# Patient Record
Sex: Male | Born: 1980 | Race: Black or African American | Hispanic: No | Marital: Single | Smoking: Current every day smoker
Health system: Southern US, Community
[De-identification: ages and names within clinical notes are randomized; demographics above are authoritative.]

## PROBLEM LIST (undated history)

## (undated) DIAGNOSIS — M549 Dorsalgia, unspecified: Secondary | ICD-10-CM

## (undated) DIAGNOSIS — K852 Alcohol induced acute pancreatitis without necrosis or infection: Secondary | ICD-10-CM

---

## 2004-07-01 ENCOUNTER — Emergency Department (HOSPITAL_COMMUNITY): Admission: EM | Admit: 2004-07-01 | Discharge: 2004-07-01 | Payer: Self-pay | Admitting: Emergency Medicine

## 2004-07-02 ENCOUNTER — Emergency Department (HOSPITAL_COMMUNITY): Admission: EM | Admit: 2004-07-02 | Discharge: 2004-07-02 | Payer: Self-pay | Admitting: Emergency Medicine

## 2005-07-27 ENCOUNTER — Emergency Department (HOSPITAL_COMMUNITY): Admission: EM | Admit: 2005-07-27 | Discharge: 2005-07-27 | Payer: Self-pay | Admitting: Emergency Medicine

## 2006-04-05 ENCOUNTER — Emergency Department (HOSPITAL_COMMUNITY): Admission: EM | Admit: 2006-04-05 | Discharge: 2006-04-05 | Payer: Self-pay | Admitting: Emergency Medicine

## 2009-12-08 ENCOUNTER — Emergency Department (HOSPITAL_COMMUNITY): Admission: EM | Admit: 2009-12-08 | Discharge: 2009-12-08 | Payer: Self-pay | Admitting: Nurse Practitioner

## 2010-02-22 ENCOUNTER — Emergency Department (HOSPITAL_COMMUNITY): Admission: EM | Admit: 2010-02-22 | Discharge: 2010-02-22 | Payer: Self-pay | Admitting: Emergency Medicine

## 2010-03-05 DEATH — deceased

## 2010-03-09 ENCOUNTER — Emergency Department (HOSPITAL_COMMUNITY): Admission: EM | Admit: 2010-03-09 | Discharge: 2010-03-09 | Payer: Self-pay | Admitting: Emergency Medicine

## 2010-05-27 ENCOUNTER — Ambulatory Visit: Payer: Self-pay | Admitting: Diagnostic Radiology

## 2010-05-27 ENCOUNTER — Emergency Department (HOSPITAL_BASED_OUTPATIENT_CLINIC_OR_DEPARTMENT_OTHER): Admission: EM | Admit: 2010-05-27 | Discharge: 2010-05-27 | Payer: Self-pay | Admitting: Emergency Medicine

## 2010-12-07 ENCOUNTER — Emergency Department (HOSPITAL_BASED_OUTPATIENT_CLINIC_OR_DEPARTMENT_OTHER)
Admission: EM | Admit: 2010-12-07 | Discharge: 2010-12-07 | Disposition: A | Discharge status: Home / Self Care | Payer: Self-pay | Attending: Emergency Medicine | Admitting: Emergency Medicine

## 2010-12-07 DIAGNOSIS — M545 Low back pain, unspecified: Secondary | ICD-10-CM | POA: Insufficient documentation

## 2011-07-12 ENCOUNTER — Emergency Department (HOSPITAL_BASED_OUTPATIENT_CLINIC_OR_DEPARTMENT_OTHER)
Admission: EM | Admit: 2011-07-12 | Discharge: 2011-07-13 | Disposition: A | Discharge status: Home / Self Care | Payer: Self-pay | Attending: Emergency Medicine | Admitting: Emergency Medicine

## 2011-07-12 DIAGNOSIS — K089 Disorder of teeth and supporting structures, unspecified: Secondary | ICD-10-CM | POA: Insufficient documentation

## 2011-07-12 DIAGNOSIS — K0889 Other specified disorders of teeth and supporting structures: Secondary | ICD-10-CM

## 2011-07-12 NOTE — ED Notes (Signed)
Pt states that he was seen about 2 weeks ago for tooth pain at [facility], given vicodin 5/500 for pain, temporarily helped pain, pt has not seen dentist, told to be seen again for pain persisted.  Pt states that he has chipped teeth on the upper and lower jaw which are causing severe pain.

## 2011-07-13 MED ORDER — IBUPROFEN 400 MG PO TABS
400.0000 mg | ORAL_TABLET | Freq: Once | ORAL | Status: AC
  Administered 2011-07-13: 400 mg via ORAL
  Filled 2011-07-13: qty 1

## 2011-07-13 MED ORDER — AMOXICILLIN 500 MG PO CAPS: 500.0000 mg | ORAL_CAPSULE | Freq: Three times a day (TID) | ORAL | Status: AC

## 2011-07-13 MED ORDER — OXYCODONE-ACETAMINOPHEN 5-325 MG PO TABS
2.0000 | ORAL_TABLET | Freq: Once | ORAL | Status: AC
  Administered 2011-07-13: 2 via ORAL
  Filled 2011-07-13: qty 2

## 2011-07-13 MED ORDER — IBUPROFEN 400 MG PO TABS: 400.0000 mg | ORAL_TABLET | Freq: Four times a day (QID) | ORAL | Status: AC | PRN

## 2011-07-13 MED ORDER — OXYCODONE-ACETAMINOPHEN 5-325 MG PO TABS: 1.0000 | ORAL_TABLET | ORAL | Status: AC | PRN

## 2011-07-13 MED ORDER — CHLORHEXIDINE GLUCONATE 0.12 % MT SOLN: 15.0000 mL | Freq: Two times a day (BID) | OROMUCOSAL | Status: AC

## 2011-07-13 NOTE — ED Provider Notes (Signed)
History    29yM with toothache. Evaluated a couple weeks ago for same given abx and pain meds which helped somewhat. Pain worsening so came again for eval. Did not fu with dentistry. No fever ro chills. No difficulty breathing or swallowing. Smokes. Denies significant pmhx. Denies trauma.  CSN: [id number] Arrival date & time: 07/12/2011 10:09 PM   First MD Initiated Contact with Patient 07/12/11 2338      Chief Complaint  Patient presents with  . Dental Pain    (Consider location/radiation/quality/duration/timing/severity/associated sxs/prior treatment) HPI  History reviewed. No pertinent past medical history.  History reviewed. No pertinent past surgical history.  History reviewed. No pertinent family history.  History  Substance Use Topics  . Smoking status: Current Some Day Smoker    Types: Cigarettes  . Smokeless tobacco: Never Used  . Alcohol Use: Yes     occasionally      Review of Systems   Review of symptoms negative unless otherwise noted in HPI.   Allergies  Tramadol and Penicillins  Home Medications   Current Outpatient Rx  Name Route Sig Dispense Refill  . IBUPROFEN 800 MG PO TABS Oral Take 1,600 mg by mouth every 8 (eight) hours as needed. For pain     . AMOXICILLIN 500 MG PO CAPS Oral Take 1 capsule (500 mg total) by mouth 3 (three) times daily. 21 capsule 0  . CHLORHEXIDINE GLUCONATE 0.12 % MT SOLN Mouth/Throat Use as directed 15 mLs in the mouth or throat 2 (two) times daily. Swish and spit 120 mL 0  . IBUPROFEN 400 MG PO TABS Oral Take 1 tablet (400 mg total) by mouth every 6 (six) hours as needed for pain. 30 tablet 0  . OXYCODONE-ACETAMINOPHEN 5-325 MG PO TABS Oral Take 1 tablet by mouth every 4 (four) hours as needed for pain. 12 tablet 0    BP 117/85  Pulse 78  Temp(Src) 97.7 F (36.5 C) (Oral)  Resp 17  Ht 5\' 8"  (1.727 m)  Wt 160 lb (72.576 kg)  BMI 24.33 kg/m2  SpO2 100%  Physical Exam  Nursing note and vitals  reviewed. Constitutional: He appears well-developed and well-nourished. No distress.  HENT:  Head: Normocephalic and atraumatic.  Mouth/Throat: No oropharyngeal exudate.       Generally poor dentition. crackes in multiple teeth. No discrete drainable collection noted. Mild-moderate  Gingivitis. No bleeding noted. posteriror pharynx clear. Neck supple. No nodes. No trismus. hanlding secretions. Submental tissues soft.  Eyes: Conjunctivae are normal. Right eye exhibits no discharge. Left eye exhibits no discharge.  Neck: Normal range of motion. Neck supple.  Cardiovascular: Normal rate, regular rhythm and normal heart sounds.  Exam reveals no gallop and no friction rub.   No murmur heard. Pulmonary/Chest: Effort normal and breath sounds normal. No respiratory distress.  Musculoskeletal: He exhibits no edema and no tenderness.  Lymphadenopathy:    He has no cervical adenopathy.  Neurological: He is alert.  Skin: Skin is warm and dry.  Psychiatric: He has a normal mood and affect. His behavior is normal. Thought content normal.    ED Course  Procedures (including critical care time)  Labs Reviewed - No data to display No results found.   1. Pain, dental       MDM  29yM with dental pain. Generally poor dentition. Moderate gingivitis. No evidence of deep space infection. Handling secretions. No trismus. Submental tissues soft. Offered dental block but declined. Pt states allergy to PCN but usually given amox which hasn't had  reaction to. Reports allergy to tramadol but fine with percocet. Ongoing dental issues but says cant afford dental care. Resources provided.        Raeford Razor, MD 07/17/11 [phone]

## 2011-07-13 NOTE — ED Notes (Signed)
Care plan and safe use of meds reviewed with pt and family

## 2011-09-17 ENCOUNTER — Emergency Department (HOSPITAL_COMMUNITY)
Admission: EM | Admit: 2011-09-17 | Discharge: 2011-09-17 | Disposition: A | Discharge status: Home / Self Care | Payer: Self-pay | Source: Home / Self Care | Attending: Emergency Medicine | Admitting: Emergency Medicine

## 2012-06-02 ENCOUNTER — Emergency Department (HOSPITAL_BASED_OUTPATIENT_CLINIC_OR_DEPARTMENT_OTHER)
Admission: EM | Admit: 2012-06-02 | Discharge: 2012-06-02 | Discharge status: Against Medical Advice | Payer: Self-pay | Attending: Emergency Medicine | Admitting: Emergency Medicine

## 2012-06-02 ENCOUNTER — Encounter (HOSPITAL_BASED_OUTPATIENT_CLINIC_OR_DEPARTMENT_OTHER): Payer: Self-pay | Admitting: Emergency Medicine

## 2012-06-02 DIAGNOSIS — K089 Disorder of teeth and supporting structures, unspecified: Secondary | ICD-10-CM | POA: Insufficient documentation

## 2012-06-02 DIAGNOSIS — F17200 Nicotine dependence, unspecified, uncomplicated: Secondary | ICD-10-CM | POA: Insufficient documentation

## 2012-06-02 NOTE — ED Notes (Signed)
Called x 1 and no answer in lobby

## 2012-06-02 NOTE — ED Notes (Signed)
Pt having dental pain since last night.  Pt states he broke a tooth.  Pt has two areas of decayed teeth that are partially missing.

## 2012-06-02 NOTE — ED Notes (Signed)
Called for room assignment x 2.  Not in lobby.

## 2015-04-12 ENCOUNTER — Encounter (HOSPITAL_BASED_OUTPATIENT_CLINIC_OR_DEPARTMENT_OTHER): Payer: Self-pay | Admitting: *Deleted

## 2015-04-12 ENCOUNTER — Emergency Department (HOSPITAL_BASED_OUTPATIENT_CLINIC_OR_DEPARTMENT_OTHER)
Admission: EM | Admit: 2015-04-12 | Discharge: 2015-04-12 | Discharge status: Against Medical Advice | Payer: Self-pay | Attending: Emergency Medicine | Admitting: Emergency Medicine

## 2015-04-12 DIAGNOSIS — Y9389 Activity, other specified: Secondary | ICD-10-CM | POA: Insufficient documentation

## 2015-04-12 DIAGNOSIS — Z720 Tobacco use: Secondary | ICD-10-CM | POA: Insufficient documentation

## 2015-04-12 DIAGNOSIS — Y9289 Other specified places as the place of occurrence of the external cause: Secondary | ICD-10-CM | POA: Insufficient documentation

## 2015-04-12 DIAGNOSIS — X58XXXA Exposure to other specified factors, initial encounter: Secondary | ICD-10-CM | POA: Insufficient documentation

## 2015-04-12 DIAGNOSIS — S025XXA Fracture of tooth (traumatic), initial encounter for closed fracture: Secondary | ICD-10-CM | POA: Insufficient documentation

## 2015-04-12 DIAGNOSIS — Y998 Other external cause status: Secondary | ICD-10-CM | POA: Insufficient documentation

## 2015-04-12 NOTE — ED Notes (Signed)
Bottom left dental pain for several weeks- States tooth broke while eating today

## 2016-03-04 ENCOUNTER — Encounter (HOSPITAL_BASED_OUTPATIENT_CLINIC_OR_DEPARTMENT_OTHER): Payer: Self-pay | Admitting: Emergency Medicine

## 2016-03-04 DIAGNOSIS — F17210 Nicotine dependence, cigarettes, uncomplicated: Secondary | ICD-10-CM | POA: Insufficient documentation

## 2016-03-04 DIAGNOSIS — Z5321 Procedure and treatment not carried out due to patient leaving prior to being seen by health care provider: Secondary | ICD-10-CM | POA: Insufficient documentation

## 2016-03-04 DIAGNOSIS — Y929 Unspecified place or not applicable: Secondary | ICD-10-CM | POA: Insufficient documentation

## 2016-03-04 DIAGNOSIS — M25521 Pain in right elbow: Secondary | ICD-10-CM | POA: Insufficient documentation

## 2016-03-04 DIAGNOSIS — Y939 Activity, unspecified: Secondary | ICD-10-CM | POA: Insufficient documentation

## 2016-03-04 DIAGNOSIS — Y999 Unspecified external cause status: Secondary | ICD-10-CM | POA: Insufficient documentation

## 2016-03-04 DIAGNOSIS — W109XXA Fall (on) (from) unspecified stairs and steps, initial encounter: Secondary | ICD-10-CM | POA: Insufficient documentation

## 2016-03-04 NOTE — ED Triage Notes (Signed)
Patient states that he feel down an unknown amount of stairs last night. The patient denies any LOC during the fall. Reports that he is having pain to his left side, his right elbow and knee

## 2016-03-05 ENCOUNTER — Emergency Department (HOSPITAL_BASED_OUTPATIENT_CLINIC_OR_DEPARTMENT_OTHER)
Admission: EM | Admit: 2016-03-05 | Discharge: 2016-03-05 | Disposition: A | Discharge status: Home / Self Care | Payer: Self-pay | Attending: Dermatology | Admitting: Dermatology

## 2016-03-05 NOTE — ED Notes (Signed)
Told the patient that we were taking him back to a room in about 2 -3 minutes. The patietn walked out. When tried to stop he just waved good bye. Patient states that he felt like he had been forgotten

## 2016-03-05 NOTE — ED Notes (Signed)
Updated the patient to his status of waiting for a room

## 2016-08-18 ENCOUNTER — Emergency Department (HOSPITAL_BASED_OUTPATIENT_CLINIC_OR_DEPARTMENT_OTHER)
Admission: EM | Admit: 2016-08-18 | Discharge: 2016-08-18 | Disposition: A | Discharge status: Home / Self Care | Payer: Self-pay | Attending: Emergency Medicine | Admitting: Emergency Medicine

## 2016-08-18 ENCOUNTER — Encounter (HOSPITAL_BASED_OUTPATIENT_CLINIC_OR_DEPARTMENT_OTHER): Payer: Self-pay | Admitting: Emergency Medicine

## 2016-08-18 DIAGNOSIS — K029 Dental caries, unspecified: Secondary | ICD-10-CM | POA: Insufficient documentation

## 2016-08-18 DIAGNOSIS — K0889 Other specified disorders of teeth and supporting structures: Secondary | ICD-10-CM

## 2016-08-18 DIAGNOSIS — F17210 Nicotine dependence, cigarettes, uncomplicated: Secondary | ICD-10-CM | POA: Insufficient documentation

## 2016-08-18 MED ORDER — NAPROXEN 250 MG PO TABS
500.0000 mg | ORAL_TABLET | Freq: Once | ORAL | Status: AC
  Administered 2016-08-18: 500 mg via ORAL
  Filled 2016-08-18: qty 2

## 2016-08-18 MED ORDER — NAPROXEN 500 MG PO TABS: 500.0000 mg | ORAL_TABLET | Freq: Two times a day (BID) | ORAL | 0 refills | Status: DC

## 2016-08-18 MED ORDER — OXYCODONE-ACETAMINOPHEN 5-325 MG PO TABS: 1.0000 | ORAL_TABLET | Freq: Four times a day (QID) | ORAL | 0 refills | Status: DC | PRN

## 2016-08-18 MED ORDER — CLINDAMYCIN HCL 150 MG PO CAPS: 450.0000 mg | ORAL_CAPSULE | Freq: Three times a day (TID) | ORAL | 0 refills | Status: AC

## 2016-08-18 MED ORDER — OXYCODONE-ACETAMINOPHEN 5-325 MG PO TABS
1.0000 | ORAL_TABLET | Freq: Once | ORAL | Status: AC
  Administered 2016-08-18: 1 via ORAL
  Filled 2016-08-18: qty 1

## 2016-08-18 NOTE — ED Provider Notes (Addendum)
[facility] Provider Note   CSN: [id number] Arrival date & time: 08/18/16  2243  By signing my name below, I, David Chapman, attest that this documentation has been prepared under the direction and in the presence of Cheri Fowler, PA-C. Electronically Signed: Javier Docker, ER Scribe. 03/16/2016. 10:59 PM.  History   Chief Complaint Chief Complaint  Patient presents with  . Dental Pain   The history is provided by the patient. No language interpreter was used.    HPI Comments: David Chapman is a 36 y.o. male who presents to the Emergency Department complaining of lower left sided tooth pain with associated facial swelling since last night. He took a goody powder today without relief. He denies fever, tongue swelling, difficulty swallowing, or neck pain. He is allergic to penicillin. Tolerating secretions.   History reviewed. No pertinent past medical history.  There are no active problems to display for this patient.   History reviewed. No pertinent surgical history.   Home Medications    Prior to Admission medications   Medication Sig Start Date End Date Taking? Authorizing Provider  clindamycin (CLEOCIN) 150 MG capsule Take 3 capsules (450 mg total) by mouth 3 (three) times daily. 08/18/16 08/25/16  Cheri Fowler, PA-C  ibuprofen (ADVIL,MOTRIN) 800 MG tablet Take 1,600 mg by mouth every 8 (eight) hours as needed. For pain     Historical Provider, MD  naproxen (NAPROSYN) 500 MG tablet Take 1 tablet (500 mg total) by mouth 2 (two) times daily. 08/18/16   Cheri Fowler, PA-C  oxyCODONE-acetaminophen (PERCOCET/ROXICET) 5-325 MG tablet Take 1 tablet by mouth every 6 (six) hours as needed for severe pain. 08/18/16   Cheri Fowler, PA-C    Family History History reviewed. No pertinent family history.  Social History Social History  Substance Use Topics  . Smoking status: Current Every Day Smoker    Types: Cigarettes  . Smokeless tobacco: Never Used  . Alcohol use Yes    Comment: occasionally     Allergies   Tramadol and Penicillins   Review of Systems Review of Systems  HENT: Positive for dental problem.      Physical Exam Updated Vital Signs BP 134/97 (BP Location: Right Arm)   Pulse 90   Temp 98.2 F (36.8 C) (Oral)   Resp 16   Ht 5\' 8"  (1.727 m)   Wt 72.6 kg   SpO2 100%   BMI 24.33 kg/m   Physical Exam  Constitutional: He is oriented to person, place, and time. He appears well-developed and well-nourished.  Patient appears uncomfortable.   HENT:  Head: Normocephalic and atraumatic.  Mouth/Throat: Uvula is midline, oropharynx is clear and moist and mucous membranes are normal. No trismus in the jaw. Abnormal dentition. Dental caries present.    No tongue swelling.  Mild left lower mandibular swelling.  Airway patent. Patient tolerating secretions without difficulty.   Eyes: Conjunctivae are normal.  Neck: Normal range of motion. Neck supple.  No evidence of Ludwigs angina.  Cardiovascular: Normal rate.   Pulmonary/Chest: Effort normal.  Abdominal: He exhibits no distension.  Musculoskeletal:  Moves all extremities spontaneously.  Lymphadenopathy:    He has no cervical adenopathy.  Neurological: He is alert and oriented to person, place, and time.  Speech clear without dysarthria.   Skin: Skin is warm and dry.     ED Treatments / Results  DIAGNOSTIC STUDIES: Oxygen Saturation is 100% on RA, normal by my interpretation.    COORDINATION OF CARE: 11:01 PM Discussed  treatment plan with pt at bedside and pt agreed to plan.  Labs (all labs ordered are listed, but only abnormal results are displayed) Labs Reviewed - No data to display  EKG  EKG Interpretation None       Radiology No results found.  Procedures Procedures (including critical care time)  Medications Ordered in ED Medications  oxyCODONE-acetaminophen (PERCOCET/ROXICET) 5-325 MG per tablet 1 tablet (not administered)  naproxen (NAPROSYN) tablet  500 mg (not administered)     Initial Impression / Assessment and Plan / ED Course  I have reviewed the triage vital signs and the nursing notes.  Pertinent labs & imaging results that were available during my care of the patient were reviewed by me and considered in my medical decision making (see chart for details).  Clinical Course    Patient with dentalgia.  No abscess requiring immediate incision and drainage.  Exam not concerning for Ludwig's angina or pharyngeal abscess.  Will treat with clindamycin, naproxen, and 6 tabs of percocet,no recent prescriptions per [employer] database, 1 in last 6 months . Pt instructed to follow-up with dentist.  Discussed return precautions. Pt safe for discharge.   Final Clinical Impressions(s) / ED Diagnoses   Final diagnoses:  Pain, dental    New Prescriptions New Prescriptions   CLINDAMYCIN (CLEOCIN) 150 MG CAPSULE    Take 3 capsules (450 mg total) by mouth 3 (three) times daily.   NAPROXEN (NAPROSYN) 500 MG TABLET    Take 1 tablet (500 mg total) by mouth 2 (two) times daily.   OXYCODONE-ACETAMINOPHEN (PERCOCET/ROXICET) 5-325 MG TABLET    Take 1 tablet by mouth every 6 (six) hours as needed for severe pain.   I personally performed the services described in this documentation, which was scribed in my presence. The recorded information has been reviewed and is accurate.        Cheri FowlerKayla Gerado Nabers, PA-C 08/18/16 2309    Doug SouSam Jacubowitz, MD 08/18/16 2341    Cheri FowlerKayla Shizuko Wojdyla, PA-C 08/18/16 [id number]    Doug SouSam Jacubowitz, MD 08/19/16 [id number]

## 2016-08-18 NOTE — ED Triage Notes (Signed)
Patient reports that he is having pain and swelling to his left face and teeth

## 2016-08-18 NOTE — Discharge Instructions (Signed)
Please follow up with a Dentist for further evaluation and management.   °Call [phone] for Emergent Dental Care ° °Check this website for free, low-income or sliding scale dental services in [place]. www.freedental.us   °To find a dentist in the [place] or surrounding areas check this website: http://[email or web address].org/for-the-public/[email or web address]-[email or web address] ° °

## 2016-08-19 ENCOUNTER — Telehealth (HOSPITAL_BASED_OUTPATIENT_CLINIC_OR_DEPARTMENT_OTHER): Payer: Self-pay | Admitting: *Deleted

## 2016-08-19 NOTE — Telephone Encounter (Signed)
Pt called regarding Rx for clindamycin received yesterday. States it was going to cost $58 and he could not afford it. [facility] States they can fill the prescription for $20 (possibly less). Pt advised.

## 2017-11-03 ENCOUNTER — Emergency Department (HOSPITAL_BASED_OUTPATIENT_CLINIC_OR_DEPARTMENT_OTHER)
Admission: EM | Admit: 2017-11-03 | Discharge: 2017-11-03 | Disposition: A | Discharge status: Home / Self Care | Payer: Self-pay | Attending: Emergency Medicine | Admitting: Emergency Medicine

## 2017-11-03 ENCOUNTER — Encounter (HOSPITAL_BASED_OUTPATIENT_CLINIC_OR_DEPARTMENT_OTHER): Payer: Self-pay | Admitting: Emergency Medicine

## 2017-11-03 ENCOUNTER — Other Ambulatory Visit: Payer: Self-pay

## 2017-11-03 DIAGNOSIS — Z79899 Other long term (current) drug therapy: Secondary | ICD-10-CM | POA: Insufficient documentation

## 2017-11-03 DIAGNOSIS — K2920 Alcoholic gastritis without bleeding: Secondary | ICD-10-CM | POA: Insufficient documentation

## 2017-11-03 DIAGNOSIS — F17210 Nicotine dependence, cigarettes, uncomplicated: Secondary | ICD-10-CM | POA: Insufficient documentation

## 2017-11-03 HISTORY — DX: Alcohol induced acute pancreatitis without necrosis or infection: K85.20

## 2017-11-03 LAB — URINALYSIS, ROUTINE W REFLEX MICROSCOPIC
Bilirubin Urine: NEGATIVE
Glucose, UA: NEGATIVE mg/dL
Hgb urine dipstick: NEGATIVE
Ketones, ur: NEGATIVE mg/dL
LEUKOCYTES UA: NEGATIVE
NITRITE: NEGATIVE
Protein, ur: NEGATIVE mg/dL
SPECIFIC GRAVITY, URINE: 1.02 (ref 1.005–1.030)
pH: 8 (ref 5.0–8.0)

## 2017-11-03 LAB — COMPREHENSIVE METABOLIC PANEL
ALK PHOS: 59 U/L (ref 38–126)
ALT: 22 U/L (ref 17–63)
ANION GAP: 11 (ref 5–15)
AST: 30 U/L (ref 15–41)
Albumin: 4.4 g/dL (ref 3.5–5.0)
BILIRUBIN TOTAL: 0.6 mg/dL (ref 0.3–1.2)
BUN: 15 mg/dL (ref 6–20)
CALCIUM: 9.3 mg/dL (ref 8.9–10.3)
CO2: 23 mmol/L (ref 22–32)
Chloride: 101 mmol/L (ref 101–111)
Creatinine, Ser: 0.85 mg/dL (ref 0.61–1.24)
GFR calc non Af Amer: >60 mL/min (ref >60)
Glucose, Bld: 103 mg/dL — ABNORMAL HIGH (ref 65–99)
POTASSIUM: 3.7 mmol/L (ref 3.5–5.1)
SODIUM: 135 mmol/L (ref 135–145)
TOTAL PROTEIN: 7.7 g/dL (ref 6.5–8.1)

## 2017-11-03 LAB — CBC
HCT: 40.5 % (ref 39.0–52.0)
HEMOGLOBIN: 14 g/dL (ref 13.0–17.0)
MCH: 31.9 pg (ref 26.0–34.0)
MCHC: 34.6 g/dL (ref 30.0–36.0)
MCV: 92.3 fL (ref 78.0–100.0)
Platelets: 296 10*3/uL (ref 150–400)
RBC: 4.39 MIL/uL (ref 4.22–5.81)
RDW: 13.5 % (ref 11.5–15.5)
WBC: 8.1 10*3/uL (ref 4.0–10.5)

## 2017-11-03 LAB — LIPASE, BLOOD: Lipase: 99 U/L — ABNORMAL HIGH (ref 11–51)

## 2017-11-03 MED ORDER — GI COCKTAIL ~~LOC~~
30.0000 mL | Freq: Once | ORAL | Status: AC
  Administered 2017-11-03: 30 mL via ORAL
  Filled 2017-11-03: qty 30

## 2017-11-03 MED ORDER — SUCRALFATE 1 G PO TABS: 1.0000 g | ORAL_TABLET | Freq: Three times a day (TID) | ORAL | 0 refills | Status: DC

## 2017-11-03 MED ORDER — PANTOPRAZOLE SODIUM 20 MG PO TBEC: 20.0000 mg | DELAYED_RELEASE_TABLET | Freq: Every day | ORAL | 0 refills | Status: DC

## 2017-11-03 MED ORDER — ONDANSETRON HCL 4 MG/2ML IJ SOLN
4.0000 mg | Freq: Once | INTRAMUSCULAR | Status: AC
  Administered 2017-11-03: 4 mg via INTRAVENOUS
  Filled 2017-11-03: qty 2

## 2017-11-03 MED ORDER — HYDROMORPHONE HCL 1 MG/ML IJ SOLN
0.5000 mg | Freq: Once | INTRAMUSCULAR | Status: AC
  Administered 2017-11-03: 0.5 mg via INTRAVENOUS
  Filled 2017-11-03: qty 1

## 2017-11-03 MED ORDER — SODIUM CHLORIDE 0.9 % IV BOLUS
1000.0000 mL | Freq: Once | INTRAVENOUS | Status: AC
  Administered 2017-11-03: 1000 mL via INTRAVENOUS

## 2017-11-03 NOTE — ED Provider Notes (Signed)
[facility] EMERGENCY DEPARTMENT Provider Note   CSN: [id number] Arrival date & time: 11/03/17  1919     History   Chief Complaint Chief Complaint  Patient presents with  . Abdominal Pain    HPI David Chapman is a 37 y.o. male.  HPI David Chapman is a 37 y.o. male presents to emergency department with complaint of abdominal pain.  Patient states his abdominal pain began this morning.  He reports associated nausea, no vomiting.  He states that he is unable to eat or drink.  He is not vomiting.  No diarrhea.  Last bowel movement yesterday, normal color.  No urinary symptoms.  Patient is a heavy alcohol drinker, states last drink was yesterday.  Reports history of pancreatitis and feels the same.  States he was also told he may have gastric ulcers but he is not sure.  He has tried Aleve and BC powder today which has not helped.  Denies fever or chills.  No URI symptoms. Nothing making symptoms better or worse.   Past Medical History:  Diagnosis Date  . Alcohol induced acute pancreatitis     There are no active problems to display for this patient.   History reviewed. No pertinent surgical history.      Home Medications    Prior to Admission medications   Medication Sig Start Date End Date Taking? Authorizing Provider  ibuprofen (ADVIL,MOTRIN) 800 MG tablet Take 1,600 mg by mouth every 8 (eight) hours as needed. For pain     [provider]  naproxen (NAPROSYN) 500 MG tablet Take 1 tablet (500 mg total) by mouth 2 (two) times daily. 08/18/16   Cheri Fowler, PA-C  oxyCODONE-acetaminophen (PERCOCET/ROXICET) 5-325 MG tablet Take 1 tablet by mouth every 6 (six) hours as needed for severe pain. 08/18/16   Cheri Fowler, PA-C    Family History No family history on file.  Social History Social History   Tobacco Use  . Smoking status: Current Every Day Smoker    Types: Cigarettes  . Smokeless tobacco: Never Used  Substance Use Topics  . Alcohol use: Yes   Comment: daily  . Drug use: Yes    Types: Marijuana     Allergies   Tramadol and Penicillins   Review of Systems Review of Systems  Constitutional: Negative for chills and fever.  Respiratory: Negative for cough, chest tightness and shortness of breath.   Cardiovascular: Negative for chest pain, palpitations and leg swelling.  Gastrointestinal: Positive for abdominal pain. Negative for abdominal distention, diarrhea, nausea and vomiting.  Genitourinary: Negative for dysuria, frequency, hematuria and urgency.  Musculoskeletal: Negative for arthralgias, myalgias, neck pain and neck stiffness.  Skin: Negative for rash.  Allergic/Immunologic: Negative for immunocompromised state.  Neurological: Negative for dizziness, weakness, light-headedness, numbness and headaches.  All other systems reviewed and are negative.    Physical Exam Updated Vital Signs BP (!) 135/93 (BP Location: Left Arm)   Pulse 68   Temp 98.1 F (36.7 C) (Oral)   Resp 20   Ht 5\' 9"  (1.753 m)   Wt 72.4 kg (159 lb 9.8 oz)   SpO2 99%   BMI 23.57 kg/m   Physical Exam  Constitutional: He appears well-developed and well-nourished. No distress.  HENT:  Head: Normocephalic and atraumatic.  Eyes: Conjunctivae are normal.  Neck: Neck supple.  Cardiovascular: Normal rate, regular rhythm and normal heart sounds.  Pulmonary/Chest: Effort normal. No respiratory distress. He has no wheezes. He has no rales.  Abdominal: Soft. Bowel sounds  are normal. He exhibits no distension. There is tenderness. There is no rebound.  RUQ, LUQ, epigastric tenderness  Musculoskeletal: He exhibits no edema.  Neurological: He is alert.  Skin: Skin is warm and dry.  Nursing note and vitals reviewed.    ED Treatments / Results  Labs (all labs ordered are listed, but only abnormal results are displayed) Labs Reviewed  LIPASE, BLOOD - Abnormal; Notable for the following components:      Result Value   Lipase 99 (*)    All other  components within normal limits  COMPREHENSIVE METABOLIC PANEL - Abnormal; Notable for the following components:   Glucose, Bld 103 (*)    All other components within normal limits  URINALYSIS, ROUTINE W REFLEX MICROSCOPIC - Abnormal; Notable for the following components:   APPearance CLOUDY (*)    All other components within normal limits  CBC    EKG None  Radiology No results found.  Procedures Procedures (including critical care time)  Medications Ordered in ED Medications  sodium chloride 0.9 % bolus 1,000 mL (has no administration in time range)  ondansetron (ZOFRAN) injection 4 mg (has no administration in time range)  gi cocktail (Maalox,Lidocaine,Donnatal) (has no administration in time range)     Initial Impression / Assessment and Plan / ED Course  I have reviewed the triage vital signs and the nursing notes.  Pertinent labs & imaging results that were available during my care of the patient were reviewed by me and considered in my medical decision making (see chart for details).    Patient with history of alcohol induced pancreatitis, here with similar pain.  No guarding or rebound tenderness on exam.  No rigidity.  Differential includes pancreatitis, colitis, gastritis, peptic ulcer disease.  Will get labs, fluids ordered.  Will try GI cocktail.  If no improvement with GI cocktail will give pain medications.\  Lipase 99, normal LFTs, normal CBC.  Patient had no symptom relief with GI cocktail.  1 dose of pain medications ordered.  Will reassess.  10:08 PM Patient is drinking ginger ale with no difficulty.  He is not experiencing any nausea or vomiting.  He states pain is much better.  Question mild pancreatitis versus gastritis from alcohol use as well as NSAID use.  Discussed stopping BC powder and Aleve.  Discussed stopping alcohol.  Will start on Carafate and Protonix. Clear liquid diet for several days.  Follow-up with family doctor.  Vitals:   11/03/17 1926  11/03/17 2100 11/03/17 2130  BP: (!) 135/93 117/74 113/71  Pulse: 68 67 65  Resp: 20 18   Temp: 98.1 F (36.7 C)    TempSrc: Oral    SpO2: 99% 97% 100%  Weight: 72.4 kg (159 lb 9.8 oz)    Height: 5\' 9"  (1.753 m)       Final Clinical Impressions(s) / ED Diagnoses   Final diagnoses:  Acute alcoholic gastritis without hemorrhage    ED Discharge Orders        Ordered    pantoprazole (PROTONIX) 20 MG tablet  Daily     11/03/17 2210    sucralfate (CARAFATE) 1 g tablet  3 times daily with meals & bedtime     11/03/17 2210       Jaynie CrumbleKirichenko, Kushal Saunders, PA-C 11/03/17 2211    Maia PlanLong, Joshua G, MD 11/04/17 [id number]

## 2017-11-03 NOTE — Discharge Instructions (Addendum)
Stop drinking alcohol.  Stop smoking.  Did not take Aleve or BC powder, he can irritate your stomach further.  Avoid spicy foods or caffeinated beverages.  Start taking Protonix daily.  Take Carafate with meals and at bedtime.  Follow-up with family doctor.  Return if worsening symptoms

## 2017-11-03 NOTE — ED Triage Notes (Signed)
Pt c/o epigastric pain x today-states "I know it's my pancreas"-NAD-steady gait

## 2017-11-03 NOTE — ED Notes (Signed)
PO challenge started

## 2018-01-03 ENCOUNTER — Emergency Department (HOSPITAL_BASED_OUTPATIENT_CLINIC_OR_DEPARTMENT_OTHER)
Admission: EM | Admit: 2018-01-03 | Discharge: 2018-01-03 | Disposition: A | Discharge status: Home / Self Care | Payer: Self-pay | Attending: Physician Assistant | Admitting: Physician Assistant

## 2018-01-03 ENCOUNTER — Encounter (HOSPITAL_BASED_OUTPATIENT_CLINIC_OR_DEPARTMENT_OTHER): Payer: Self-pay | Admitting: Emergency Medicine

## 2018-01-03 ENCOUNTER — Other Ambulatory Visit: Payer: Self-pay

## 2018-01-03 DIAGNOSIS — M5432 Sciatica, left side: Secondary | ICD-10-CM | POA: Insufficient documentation

## 2018-01-03 DIAGNOSIS — Z79899 Other long term (current) drug therapy: Secondary | ICD-10-CM | POA: Insufficient documentation

## 2018-01-03 DIAGNOSIS — F17210 Nicotine dependence, cigarettes, uncomplicated: Secondary | ICD-10-CM | POA: Insufficient documentation

## 2018-01-03 DIAGNOSIS — F1290 Cannabis use, unspecified, uncomplicated: Secondary | ICD-10-CM | POA: Insufficient documentation

## 2018-01-03 MED ORDER — CYCLOBENZAPRINE HCL 10 MG PO TABS: 10.0000 mg | ORAL_TABLET | Freq: Two times a day (BID) | ORAL | 0 refills | Status: DC | PRN

## 2018-01-03 MED ORDER — KETOROLAC TROMETHAMINE 30 MG/ML IJ SOLN
30.0000 mg | Freq: Once | INTRAMUSCULAR | Status: AC
  Administered 2018-01-03: 30 mg via INTRAMUSCULAR
  Filled 2018-01-03: qty 1

## 2018-01-03 MED ORDER — NAPROXEN 500 MG PO TABS: 500.0000 mg | ORAL_TABLET | Freq: Two times a day (BID) | ORAL | 0 refills | Status: DC

## 2018-01-03 MED ORDER — CLINDAMYCIN HCL 150 MG PO CAPS: 300.0000 mg | ORAL_CAPSULE | Freq: Once | ORAL | Status: DC

## 2018-01-03 NOTE — ED Triage Notes (Signed)
States hurt his left hamstring muscles after cutting the grass. Pain worse this am, has not taken OTC med for pain

## 2018-01-03 NOTE — ED Provider Notes (Signed)
[facility] EMERGENCY DEPARTMENT Provider Note   CSN: [id number] Arrival date & time: 01/03/18  1301     History   Chief Complaint Chief Complaint  Patient presents with  . Leg Pain    HPI David Chapman is a 37 y.o. male with a past medical history of L buttock pain radiating down his left leg.  Symptoms began gradually yesterday.  They began after he was mowing the lawn outside on a steep hill.  He states that he was pushing and pulling the lawnmower with a lot of strength and he believes that this is the cause of his symptoms.  He has not taken any medications to help with his pain.  States that the pain is worse with movement and palpation.  He denies any injuries or falls, loss of bowel or bladder function, prior back or hip surgeries, numbness in legs, history of DVT, recent surgeries, hematuria, dysuria or history of kidney stones.  HPI  Past Medical History:  Diagnosis Date  . Alcohol induced acute pancreatitis     There are no active problems to display for this patient.   History reviewed. No pertinent surgical history.      Home Medications    Prior to Admission medications   Medication Sig Start Date End Date Taking? Authorizing Provider  cyclobenzaprine (FLEXERIL) 10 MG tablet Take 1 tablet (10 mg total) by mouth 2 (two) times daily as needed for muscle spasms. 01/03/18   Kayzlee Wirtanen, PA-C  ibuprofen (ADVIL,MOTRIN) 800 MG tablet Take 1,600 mg by mouth every 8 (eight) hours as needed. For pain     [provider]  naproxen (NAPROSYN) 500 MG tablet Take 1 tablet (500 mg total) by mouth 2 (two) times daily. 01/03/18   Leovanni Bjorkman, PA-C  oxyCODONE-acetaminophen (PERCOCET/ROXICET) 5-325 MG tablet Take 1 tablet by mouth every 6 (six) hours as needed for severe pain. 08/18/16   Cheri Fowler, PA-C  pantoprazole (PROTONIX) 20 MG tablet Take 1 tablet (20 mg total) by mouth daily. 11/03/17   Kirichenko, Tatyana, PA-C  sucralfate (CARAFATE) 1 g tablet Take 1  tablet (1 g total) by mouth 4 (four) times daily -  with meals and at bedtime. 11/03/17   Jaynie Crumble, PA-C    Family History No family history on file.  Social History Social History   Tobacco Use  . Smoking status: Current Every Day Smoker    Types: Cigarettes  . Smokeless tobacco: Never Used  Substance Use Topics  . Alcohol use: Not Currently    Comment: daily  . Drug use: Yes    Types: Marijuana     Allergies   Tramadol and Penicillins   Review of Systems Review of Systems  Constitutional: Positive for chills. Negative for fever.  Genitourinary: Negative for dysuria and hematuria.  Musculoskeletal: Positive for myalgias. Negative for back pain and neck pain.     Physical Exam Updated Vital Signs BP 119/86 (BP Location: Left Arm)   Pulse 80   Temp 97.7 F (36.5 C) (Oral)   Resp 16   Ht 5\' 8"  (1.727 m)   Wt 72.6 kg (160 lb)   SpO2 100%   BMI 24.33 kg/m   Physical Exam  Constitutional: He appears well-developed and well-nourished. No distress.  Nontoxic appearing and in no acute distress.  HENT:  Head: Normocephalic and atraumatic.  Eyes: Conjunctivae and EOM are normal. No scleral icterus.  Neck: Normal range of motion.  Pulmonary/Chest: Effort normal. No respiratory distress.  Musculoskeletal:  Legs: No midline spinal tenderness present in lumbar, thoracic or cervical spine. No step-off palpated. No visible bruising, edema or temperature change noted. No objective signs of numbness present. No saddle anesthesia. 2+ DP pulses bilaterally. Sensation intact to light touch. Strength 5/5 in bilateral lower extremities.  Neurological: He is alert.  Skin: No rash noted. He is not diaphoretic.  Psychiatric: He has a normal mood and affect.  Nursing note and vitals reviewed.    ED Treatments / Results  Labs (all labs ordered are listed, but only abnormal results are displayed) Labs Reviewed - No data to display  EKG None  Radiology No  results found.  Procedures Procedures (including critical care time)  Medications Ordered in ED Medications  ketorolac (TORADOL) 30 MG/ML injection 30 mg (has no administration in time range)     Initial Impression / Assessment and Plan / ED Course  I have reviewed the triage vital signs and the nursing notes.  Pertinent labs & imaging results that were available during my care of the patient were reviewed by me and considered in my medical decision making (see chart for details).     Patient presents to ED for evaluation of left buttock pain radiating down his left leg.  Symptoms began yesterday after he was mowing the lawn.  He states that he was mowing on a steep hill and was constantly pushing and pulling the lawnmower.  He believes that this is the cause of his symptoms.  He has not taken any medications to help with the symptoms.  On physical exam there is tenderness to palpation of the musculature in the area as indicated in the image.  No changes in sensation noted.  There is neurovascularly intact.  He has no midline spinal tenderness to palpation, no injury or trauma noted.  Suspect symptoms are musculoskeletal in nature. Doubt infectious or vascular cause of symptoms.  Will advise him to use anti-inflammatories, muscle relaxer, heat therapy, stretching and massaging to help with his symptoms.  Advised to return to ED for any severe worsening symptoms.  Portions of this note were generated with Scientist, clinical (histocompatibility and immunogenetics)Dragon dictation software. Dictation errors may occur despite best attempts at proofreading.   Final Clinical Impressions(s) / ED Diagnoses   Final diagnoses:  Sciatica of left side    ED Discharge Orders        Ordered    naproxen (NAPROSYN) 500 MG tablet  2 times daily     01/03/18 1349    cyclobenzaprine (FLEXERIL) 10 MG tablet  2 times daily PRN     01/03/18 1349       Dietrich PatesKhatri, Abbott Jasinski, PA-C 01/03/18 1350    Mackuen, Cindee Saltourteney Lyn, MD 01/03/18 1535

## 2018-01-05 ENCOUNTER — Emergency Department (HOSPITAL_BASED_OUTPATIENT_CLINIC_OR_DEPARTMENT_OTHER)
Admission: EM | Admit: 2018-01-05 | Discharge: 2018-01-05 | Disposition: A | Discharge status: Home / Self Care | Payer: Self-pay | Attending: Emergency Medicine | Admitting: Emergency Medicine

## 2018-01-05 ENCOUNTER — Other Ambulatory Visit: Payer: Self-pay

## 2018-01-05 ENCOUNTER — Encounter (HOSPITAL_BASED_OUTPATIENT_CLINIC_OR_DEPARTMENT_OTHER): Payer: Self-pay | Admitting: Emergency Medicine

## 2018-01-05 DIAGNOSIS — M79605 Pain in left leg: Secondary | ICD-10-CM | POA: Insufficient documentation

## 2018-01-05 DIAGNOSIS — Z5321 Procedure and treatment not carried out due to patient leaving prior to being seen by health care provider: Secondary | ICD-10-CM | POA: Insufficient documentation

## 2018-01-05 NOTE — ED Triage Notes (Signed)
Patient states that since he left here 2 days ago the pain has become worse to his left leg. The patient reports that none of the medications that he has been given has helped

## 2018-01-05 NOTE — ED Notes (Signed)
Patient asking about waiting time, he states that he can not wait a long time. Patient explained the risks and benefits

## 2018-02-16 ENCOUNTER — Other Ambulatory Visit: Payer: Self-pay

## 2018-02-16 ENCOUNTER — Encounter (HOSPITAL_BASED_OUTPATIENT_CLINIC_OR_DEPARTMENT_OTHER): Payer: Self-pay | Admitting: *Deleted

## 2018-02-16 ENCOUNTER — Emergency Department (HOSPITAL_BASED_OUTPATIENT_CLINIC_OR_DEPARTMENT_OTHER)
Admission: EM | Admit: 2018-02-16 | Discharge: 2018-02-17 | Disposition: A | Discharge status: Home / Self Care | Payer: Self-pay | Attending: Emergency Medicine | Admitting: Emergency Medicine

## 2018-02-16 DIAGNOSIS — Z79899 Other long term (current) drug therapy: Secondary | ICD-10-CM | POA: Insufficient documentation

## 2018-02-16 DIAGNOSIS — K852 Alcohol induced acute pancreatitis without necrosis or infection: Secondary | ICD-10-CM | POA: Insufficient documentation

## 2018-02-16 DIAGNOSIS — F17210 Nicotine dependence, cigarettes, uncomplicated: Secondary | ICD-10-CM | POA: Insufficient documentation

## 2018-02-16 LAB — COMPREHENSIVE METABOLIC PANEL
ALBUMIN: 4 g/dL (ref 3.5–5.0)
ALT: 31 U/L (ref 0–44)
ANION GAP: 9 (ref 5–15)
AST: 39 U/L (ref 15–41)
Alkaline Phosphatase: 68 U/L (ref 38–126)
BUN: 11 mg/dL (ref 6–20)
CHLORIDE: 104 mmol/L (ref 98–111)
CO2: 24 mmol/L (ref 22–32)
Calcium: 9.1 mg/dL (ref 8.9–10.3)
Creatinine, Ser: 0.97 mg/dL (ref 0.61–1.24)
GFR calc non Af Amer: >60 mL/min (ref >60)
Glucose, Bld: 107 mg/dL — ABNORMAL HIGH (ref 70–99)
Potassium: 3.8 mmol/L (ref 3.5–5.1)
SODIUM: 137 mmol/L (ref 135–145)
Total Bilirubin: 0.5 mg/dL (ref 0.3–1.2)
Total Protein: 7.1 g/dL (ref 6.5–8.1)

## 2018-02-16 LAB — URINALYSIS, ROUTINE W REFLEX MICROSCOPIC
Bilirubin Urine: NEGATIVE
GLUCOSE, UA: NEGATIVE mg/dL
HGB URINE DIPSTICK: NEGATIVE
Ketones, ur: NEGATIVE mg/dL
Leukocytes, UA: NEGATIVE
Nitrite: NEGATIVE
PH: 7 (ref 5.0–8.0)
Protein, ur: NEGATIVE mg/dL
SPECIFIC GRAVITY, URINE: 1.015 (ref 1.005–1.030)

## 2018-02-16 LAB — CBC
HEMATOCRIT: 40.3 % (ref 39.0–52.0)
HEMOGLOBIN: 13.9 g/dL (ref 13.0–17.0)
MCH: 32.3 pg (ref 26.0–34.0)
MCHC: 34.5 g/dL (ref 30.0–36.0)
MCV: 93.7 fL (ref 78.0–100.0)
Platelets: 298 10*3/uL (ref 150–400)
RBC: 4.3 MIL/uL (ref 4.22–5.81)
RDW: 13.2 % (ref 11.5–15.5)
WBC: 9.3 10*3/uL (ref 4.0–10.5)

## 2018-02-16 LAB — LIPASE, BLOOD: Lipase: 96 U/L — ABNORMAL HIGH (ref 11–51)

## 2018-02-16 MED ORDER — GI COCKTAIL ~~LOC~~
30.0000 mL | Freq: Once | ORAL | Status: AC
  Administered 2018-02-16: 30 mL via ORAL
  Filled 2018-02-16: qty 30

## 2018-02-16 MED ORDER — SODIUM CHLORIDE 0.9 % IV SOLN: 1000.0000 mL | INTRAVENOUS | Status: DC

## 2018-02-16 MED ORDER — HYDROMORPHONE HCL 1 MG/ML IJ SOLN
1.0000 mg | Freq: Once | INTRAMUSCULAR | Status: AC
  Administered 2018-02-16: 1 mg via INTRAVENOUS
  Filled 2018-02-16: qty 1

## 2018-02-16 MED ORDER — SODIUM CHLORIDE 0.9 % IV BOLUS (SEPSIS)
1000.0000 mL | Freq: Once | INTRAVENOUS | Status: AC
Start: 2018-02-16 — End: 2018-02-17
  Administered 2018-02-16: 1000 mL via INTRAVENOUS

## 2018-02-16 MED ORDER — ONDANSETRON HCL 4 MG/2ML IJ SOLN
4.0000 mg | Freq: Once | INTRAMUSCULAR | Status: AC
  Administered 2018-02-16: 4 mg via INTRAVENOUS
  Filled 2018-02-16: qty 2

## 2018-02-16 NOTE — ED Provider Notes (Signed)
[facility] EMERGENCY DEPARTMENT Provider Note   CSN: [id number] Arrival date & time: 02/16/18  2133     History   Chief Complaint Chief Complaint  Patient presents with  . Abdominal Pain    HPI David Chapman is a 37 y.o. male.  Patient presents with diffuse abdominal pain worse in his upper abdomen but spreads diffusely.  His pain is constant and associated with nausea but no vomiting.  Pain is similar to previous episodes of pancreatitis due to alcohol.  Patient admits to using alcohol yesterday.  States he does not drink every day.  Not take any medication for reflux or ulcers.  He denies any chest pain or shortness of breath.  Denies any diarrhea.  Denies any pain with urination or blood in the urine.  Denies any regular medication use.  Does use marijuana but no other drugs. No previous abdominal surgeries.  The history is provided by the patient.  Abdominal Pain   Associated symptoms include nausea. Pertinent negatives include vomiting, dysuria, hematuria, headaches, arthralgias and myalgias.    Past Medical History:  Diagnosis Date  . Alcohol induced acute pancreatitis     There are no active problems to display for this patient.   History reviewed. No pertinent surgical history.      Home Medications    Prior to Admission medications   Medication Sig Start Date End Date Taking? Authorizing Provider  cyclobenzaprine (FLEXERIL) 10 MG tablet Take 1 tablet (10 mg total) by mouth 2 (two) times daily as needed for muscle spasms. 01/03/18   Khatri, Hina, PA-C  ibuprofen (ADVIL,MOTRIN) 800 MG tablet Take 1,600 mg by mouth every 8 (eight) hours as needed. For pain     [provider]  naproxen (NAPROSYN) 500 MG tablet Take 1 tablet (500 mg total) by mouth 2 (two) times daily. 01/03/18   Khatri, Hina, PA-C  oxyCODONE-acetaminophen (PERCOCET/ROXICET) 5-325 MG tablet Take 1 tablet by mouth every 6 (six) hours as needed for severe pain. 08/18/16   Cheri Fowler,  PA-C  pantoprazole (PROTONIX) 20 MG tablet Take 1 tablet (20 mg total) by mouth daily. 11/03/17   Kirichenko, Tatyana, PA-C  sucralfate (CARAFATE) 1 g tablet Take 1 tablet (1 g total) by mouth 4 (four) times daily -  with meals and at bedtime. 11/03/17   Jaynie Crumble, PA-C    Family History No family history on file.  Social History Social History   Tobacco Use  . Smoking status: Current Every Day Smoker    Types: Cigarettes  . Smokeless tobacco: Never Used  Substance Use Topics  . Alcohol use: Not Currently    Comment: daily  . Drug use: Yes    Types: Marijuana     Allergies   Tramadol and Penicillins   Review of Systems Review of Systems  Constitutional: Positive for activity change and appetite change.  HENT: Negative for congestion.   Respiratory: Negative for cough, chest tightness and shortness of breath.   Cardiovascular: Negative for chest pain.  Gastrointestinal: Positive for abdominal pain and nausea. Negative for vomiting.  Genitourinary: Negative for dysuria, hematuria and testicular pain.  Musculoskeletal: Negative for arthralgias and myalgias.  Skin: Negative for rash.  Neurological: Negative for dizziness, weakness and headaches.   all other systems are negative except as noted in the HPI and PMH.     Physical Exam Updated Vital Signs BP (!) 129/93   Pulse 62   Temp 97.9 F (36.6 C) (Oral)   Resp 14  Ht 5\' 8"  (1.727 m)   Wt 72.6 kg (160 lb)   SpO2 100%   BMI 24.33 kg/m   Physical Exam  Constitutional: He is oriented to person, place, and time. He appears well-developed and well-nourished. No distress.  HENT:  Head: Normocephalic and atraumatic.  Mouth/Throat: Oropharynx is clear and moist. No oropharyngeal exudate.  Eyes: Pupils are equal, round, and reactive to light. Conjunctivae and EOM are normal.  Neck: Normal range of motion. Neck supple.  No meningismus.  Cardiovascular: Normal rate, regular rhythm, normal heart sounds and  intact distal pulses.  No murmur heard. Pulmonary/Chest: Effort normal and breath sounds normal. No respiratory distress.  Abdominal: Soft. There is tenderness. There is no rebound and no guarding.  Diffuse abdominal tenderness. With voluntary guarding. Worse in epigastrium. Negative Murphy's sign  Musculoskeletal: Normal range of motion. He exhibits no edema or tenderness.  Neurological: He is alert and oriented to person, place, and time. No cranial nerve deficit. He exhibits normal muscle tone. Coordination normal.  No ataxia on finger to nose bilaterally. No pronator drift. 5/5 strength throughout. CN 2-12 intact.Equal grip strength. Sensation intact.   Skin: Skin is warm.  Psychiatric: He has a normal mood and affect. His behavior is normal.  Nursing note and vitals reviewed.    ED Treatments / Results  Labs (all labs ordered are listed, but only abnormal results are displayed) Labs Reviewed  LIPASE, BLOOD - Abnormal; Notable for the following components:      Result Value   Lipase 96 (*)    All other components within normal limits  COMPREHENSIVE METABOLIC PANEL - Abnormal; Notable for the following components:   Glucose, Bld 107 (*)    All other components within normal limits  RAPID URINE DRUG SCREEN, HOSP PERFORMED - Abnormal; Notable for the following components:   Cocaine POSITIVE (*)    Tetrahydrocannabinol POSITIVE (*)    Barbiturates   (*)    Value: Result not available. Reagent lot number recalled by manufacturer.   All other components within normal limits  CBC  URINALYSIS, ROUTINE W REFLEX MICROSCOPIC    EKG None  Radiology Ct Abdomen Pelvis W Contrast  Result Date: 02/17/2018 CLINICAL DATA:  37 year old male with abdominal pain. Concern for acute pancreatitis. EXAM: CT ABDOMEN AND PELVIS WITH CONTRAST TECHNIQUE: Multidetector CT imaging of the abdomen and pelvis was performed using the standard protocol following bolus administration of intravenous  contrast. CONTRAST:  100mL ISOVUE-300 IOPAMIDOL (ISOVUE-300) INJECTION 61% COMPARISON:  CT of the abdomen pelvis dated 02/24/2017 FINDINGS: Lower chest: Minimal bibasilar atelectatic changes. The visualized lung bases are otherwise clear. No intra-abdominal free air.  Small free fluid within the pelvis. Hepatobiliary: A 1.5 x 2 cm hyperenhancing focus in the anterior liver similar to prior CT is not well characterized but may represent a flash filling hemangioma or portal venous shunting. Further evaluation with MRI on a nonemergent basis as previously suggested. No intrahepatic biliary ductal dilatation. The gallbladder is unremarkable. Pancreas: There is inflammatory changes of the head and uncinate process of the pancreas consistent with acute pancreatitis. No drainable fluid collection, abscess, or pseudocyst. Spleen: Normal in size without focal abnormality. Adrenals/Urinary Tract: Adrenal glands are unremarkable. Kidneys are normal, without renal calculi, focal lesion, or hydronephrosis. Bladder is unremarkable. Stomach/Bowel: There is no bowel obstruction or active inflammation. Normal caliber fecalized distal small bowel loops may represent increased transit time or small intestine bacterial overgrowth. Clinical correlation is recommended. The appendix is normal. Vascular/Lymphatic: No significant vascular findings  are present. No enlarged abdominal or pelvic lymph nodes. Reproductive: The prostate and seminal vesicles are grossly unremarkable. Other: None Musculoskeletal: No acute or significant osseous findings. IMPRESSION: 1. Acute pancreatitis.  No abscess or pseudocyst. 2. Enhancing lesion in the anterior liver similar to prior CT may represent a flash filling hemangioma or portal venous shunting. Further evaluation with MRI on a nonemergent basis recommended. Electronically Signed   By: Elgie Collard M.D.   On: 02/17/2018 02:45    Procedures Procedures (including critical care  time)  Medications Ordered in ED Medications  HYDROmorphone (DILAUDID) injection 1 mg (has no administration in time range)  gi cocktail (Maalox,Lidocaine,Donnatal) (has no administration in time range)  ondansetron (ZOFRAN) injection 4 mg (has no administration in time range)  sodium chloride 0.9 % bolus 1,000 mL (has no administration in time range)    Followed by  0.9 %  sodium chloride infusion (has no administration in time range)     Initial Impression / Assessment and Plan / ED Course  I have reviewed the triage vital signs and the nursing notes.  Pertinent labs & imaging results that were available during my care of the patient were reviewed by me and considered in my medical decision making (see chart for details).    Upper abdominal pain with nausea similar previous episodes of pancreatitis.  Does admit to drinking alcohol yesterday.  No previous abdominal surgeries.  Patient given symptom control and IV fluids.  Lipase minimally elevated at 96.  LFTs are normal.  Patient feels improved after treatment in the ED and is requesting sips of water which he is tolerating well.  Drug screen is positive for cocaine and marijuana.  CT confirms uncomplicated pancreatitis.  Patient is tolerating p.o. Liquids.  Admission versus discharge discussed with patient.  He states his pain is improved and he wishes to go home.  Discussed clear liquid diet for the next several days as well as antiemetics.  [facility] narcotic database reviewed with no recent narcotic prescriptions.  Small course of pain medication will be given.  Advised to avoid alcohol, all drugs, caffeine, spicy foods, NSAIDs.  Follow-up with PCP.  Return precautions discussed.   Final Clinical Impressions(s) / ED Diagnoses   Final diagnoses:  Alcohol-induced acute pancreatitis without infection or necrosis    ED Discharge Orders    None       Dyron Kawano, Jeannett Senior, MD 02/17/18 [id number]

## 2018-02-16 NOTE — ED Triage Notes (Signed)
Abdominal pain since last night. Hx of pancreatitis. Pain after drinking unknown amount of alcohol over the weekend.

## 2018-02-16 NOTE — ED Notes (Signed)
Attempted x 2 for blood draw with no success.

## 2018-02-16 NOTE — ED Notes (Signed)
Pt. Reports his abd. Pain started after a night of heavy drinking.  Pt. Reports he has had similar pain in past with pancreatitis.

## 2018-02-17 ENCOUNTER — Emergency Department (HOSPITAL_BASED_OUTPATIENT_CLINIC_OR_DEPARTMENT_OTHER): Payer: Self-pay

## 2018-02-17 LAB — RAPID URINE DRUG SCREEN, HOSP PERFORMED
AMPHETAMINES: NOT DETECTED
Benzodiazepines: NOT DETECTED
Cocaine: DETECTED — AB
OPIATES: NOT DETECTED
TETRAHYDROCANNABINOL: DETECTED — AB

## 2018-02-17 MED ORDER — OXYCODONE HCL 5 MG PO TABS: 5.0000 mg | ORAL_TABLET | ORAL | 0 refills | Status: DC | PRN

## 2018-02-17 MED ORDER — SODIUM CHLORIDE 0.9 % IV SOLN: 1000.0000 mL | INTRAVENOUS | Status: DC

## 2018-02-17 MED ORDER — SODIUM CHLORIDE 0.9 % IV BOLUS (SEPSIS)
1000.0000 mL | Freq: Once | INTRAVENOUS | Status: AC
  Administered 2018-02-17: 1000 mL via INTRAVENOUS

## 2018-02-17 MED ORDER — RANITIDINE HCL 150 MG PO CAPS: 150.0000 mg | ORAL_CAPSULE | Freq: Every day | ORAL | 0 refills | Status: DC

## 2018-02-17 MED ORDER — ONDANSETRON HCL 4 MG PO TABS: 4.0000 mg | ORAL_TABLET | Freq: Four times a day (QID) | ORAL | 0 refills | Status: DC

## 2018-02-17 MED ORDER — IOPAMIDOL (ISOVUE-300) INJECTION 61%
100.0000 mL | Freq: Once | INTRAVENOUS | Status: AC | PRN
  Administered 2018-02-17: 100 mL via INTRAVENOUS

## 2018-02-17 NOTE — ED Notes (Signed)
Patient transported to CT 

## 2018-02-17 NOTE — ED Notes (Signed)
Pt given water and ginger ale per request. Tolerating at this time.

## 2018-02-17 NOTE — Discharge Instructions (Addendum)
Avoid alcohol and any drugs.  Follow a clear liquid diet for the next several days as we discussed.  Return to the ED if your pain becomes worse, you develop a fever, persistent vomiting or any other concerns.

## 2018-07-13 ENCOUNTER — Encounter (HOSPITAL_BASED_OUTPATIENT_CLINIC_OR_DEPARTMENT_OTHER): Payer: Self-pay

## 2018-07-13 ENCOUNTER — Other Ambulatory Visit: Payer: Self-pay

## 2018-07-13 ENCOUNTER — Emergency Department (HOSPITAL_BASED_OUTPATIENT_CLINIC_OR_DEPARTMENT_OTHER)
Admission: EM | Admit: 2018-07-13 | Discharge: 2018-07-14 | Disposition: A | Discharge status: Home / Self Care | Payer: Self-pay | Attending: Emergency Medicine | Admitting: Emergency Medicine

## 2018-07-13 DIAGNOSIS — M545 Low back pain: Secondary | ICD-10-CM | POA: Insufficient documentation

## 2018-07-13 DIAGNOSIS — Z5321 Procedure and treatment not carried out due to patient leaving prior to being seen by health care provider: Secondary | ICD-10-CM | POA: Insufficient documentation

## 2018-07-13 HISTORY — DX: Dorsalgia, unspecified: M54.9

## 2018-07-13 NOTE — ED Triage Notes (Signed)
Pt with chronic back pain-worse x 1 week-NAD-steady slow gait

## 2018-07-14 NOTE — ED Notes (Signed)
Called pt 3 times with no answer.

## 2019-11-30 ENCOUNTER — Encounter (HOSPITAL_BASED_OUTPATIENT_CLINIC_OR_DEPARTMENT_OTHER): Payer: Self-pay | Admitting: Emergency Medicine

## 2019-11-30 ENCOUNTER — Other Ambulatory Visit: Payer: Self-pay

## 2019-11-30 ENCOUNTER — Emergency Department (HOSPITAL_BASED_OUTPATIENT_CLINIC_OR_DEPARTMENT_OTHER)
Admission: EM | Admit: 2019-11-30 | Discharge: 2019-11-30 | Disposition: A | Discharge status: Home / Self Care | Payer: Self-pay | Attending: Emergency Medicine | Admitting: Emergency Medicine

## 2019-11-30 DIAGNOSIS — F17210 Nicotine dependence, cigarettes, uncomplicated: Secondary | ICD-10-CM | POA: Insufficient documentation

## 2019-11-30 DIAGNOSIS — Z79899 Other long term (current) drug therapy: Secondary | ICD-10-CM | POA: Insufficient documentation

## 2019-11-30 DIAGNOSIS — K2900 Acute gastritis without bleeding: Secondary | ICD-10-CM | POA: Insufficient documentation

## 2019-11-30 DIAGNOSIS — R1013 Epigastric pain: Secondary | ICD-10-CM | POA: Insufficient documentation

## 2019-11-30 LAB — CBC WITH DIFFERENTIAL/PLATELET
Abs Immature Granulocytes: 0.02 10*3/uL (ref 0.00–0.07)
Basophils Absolute: 0 10*3/uL (ref 0.0–0.1)
Basophils Relative: 0 %
Eosinophils Absolute: 0.1 10*3/uL (ref 0.0–0.5)
Eosinophils Relative: 1 %
HCT: 44.1 % (ref 39.0–52.0)
Hemoglobin: 15.4 g/dL (ref 13.0–17.0)
Immature Granulocytes: 0 %
Lymphocytes Relative: 44 %
Lymphs Abs: 3.1 10*3/uL (ref 0.7–4.0)
MCH: 32.3 pg (ref 26.0–34.0)
MCHC: 34.9 g/dL (ref 30.0–36.0)
MCV: 92.5 fL (ref 80.0–100.0)
Monocytes Absolute: 0.7 10*3/uL (ref 0.1–1.0)
Monocytes Relative: 10 %
Neutro Abs: 3.1 10*3/uL (ref 1.7–7.7)
Neutrophils Relative %: 45 %
Platelets: 311 10*3/uL (ref 150–400)
RBC: 4.77 MIL/uL (ref 4.22–5.81)
RDW: 13.5 % (ref 11.5–15.5)
WBC: 7 10*3/uL (ref 4.0–10.5)
nRBC: 0 % (ref 0.0–0.2)

## 2019-11-30 LAB — COMPREHENSIVE METABOLIC PANEL
ALT: 26 U/L (ref 0–44)
AST: 31 U/L (ref 15–41)
Albumin: 4.9 g/dL (ref 3.5–5.0)
Alkaline Phosphatase: 66 U/L (ref 38–126)
Anion gap: 10 (ref 5–15)
BUN: 12 mg/dL (ref 6–20)
CO2: 25 mmol/L (ref 22–32)
Calcium: 9.5 mg/dL (ref 8.9–10.3)
Chloride: 101 mmol/L (ref 98–111)
Creatinine, Ser: 0.81 mg/dL (ref 0.61–1.24)
GFR calc Af Amer: >60 mL/min (ref >60)
GFR calc non Af Amer: >60 mL/min (ref >60)
Glucose, Bld: 89 mg/dL (ref 70–99)
Potassium: 3.9 mmol/L (ref 3.5–5.1)
Sodium: 136 mmol/L (ref 135–145)
Total Bilirubin: 0.7 mg/dL (ref 0.3–1.2)
Total Protein: 8.2 g/dL — ABNORMAL HIGH (ref 6.5–8.1)

## 2019-11-30 LAB — URINALYSIS, ROUTINE W REFLEX MICROSCOPIC
Bilirubin Urine: NEGATIVE
Glucose, UA: NEGATIVE mg/dL
Hgb urine dipstick: NEGATIVE
Ketones, ur: NEGATIVE mg/dL
Leukocytes,Ua: NEGATIVE
Nitrite: NEGATIVE
Protein, ur: NEGATIVE mg/dL
Specific Gravity, Urine: 1.02 (ref 1.005–1.030)
pH: 6.5 (ref 5.0–8.0)

## 2019-11-30 LAB — LIPASE, BLOOD: Lipase: 24 U/L (ref 11–51)

## 2019-11-30 MED ORDER — LIDOCAINE VISCOUS HCL 2 % MT SOLN
15.0000 mL | Freq: Once | OROMUCOSAL | Status: AC
  Administered 2019-11-30: 15 mL via ORAL
  Filled 2019-11-30: qty 15

## 2019-11-30 MED ORDER — MORPHINE SULFATE (PF) 4 MG/ML IV SOLN
4.0000 mg | Freq: Once | INTRAVENOUS | Status: AC
  Administered 2019-11-30: 4 mg via INTRAVENOUS
  Filled 2019-11-30: qty 1

## 2019-11-30 MED ORDER — SODIUM CHLORIDE 0.9 % IV BOLUS
1000.0000 mL | Freq: Once | INTRAVENOUS | Status: AC
  Administered 2019-11-30: 1000 mL via INTRAVENOUS

## 2019-11-30 MED ORDER — ALUM & MAG HYDROXIDE-SIMETH 200-200-20 MG/5ML PO SUSP
30.0000 mL | Freq: Once | ORAL | Status: AC
  Administered 2019-11-30: 30 mL via ORAL
  Filled 2019-11-30: qty 30

## 2019-11-30 MED ORDER — SUCRALFATE 1 GM/10ML PO SUSP: 1.0000 g | Freq: Three times a day (TID) | ORAL | 0 refills | Status: DC

## 2019-11-30 MED ORDER — ONDANSETRON HCL 4 MG/2ML IJ SOLN
INTRAMUSCULAR | Status: AC
  Filled 2019-11-30: qty 2

## 2019-11-30 MED ORDER — OMEPRAZOLE 20 MG PO CPDR: 20.0000 mg | DELAYED_RELEASE_CAPSULE | Freq: Every day | ORAL | 0 refills | Status: DC

## 2019-11-30 MED ORDER — FAMOTIDINE IN NACL 20-0.9 MG/50ML-% IV SOLN
20.0000 mg | Freq: Once | INTRAVENOUS | Status: AC
  Administered 2019-11-30: 20 mg via INTRAVENOUS
  Filled 2019-11-30: qty 50

## 2019-11-30 MED ORDER — ONDANSETRON HCL 4 MG/2ML IJ SOLN
4.0000 mg | Freq: Once | INTRAMUSCULAR | Status: AC
  Administered 2019-11-30: 4 mg via INTRAVENOUS
  Filled 2019-11-30: qty 2

## 2019-11-30 MED ORDER — ONDANSETRON 4 MG PO TBDP: 4.0000 mg | ORAL_TABLET | Freq: Three times a day (TID) | ORAL | 0 refills | Status: DC | PRN

## 2019-11-30 NOTE — ED Provider Notes (Signed)
[facility] EMERGENCY DEPARTMENT Provider Note   CSN: [id number] Arrival date & time: 11/30/19  0856     History Chief Complaint  Patient presents with  . Abdominal Pain    David Chapman is a 39 y.o. male.  The history is provided by the patient and medical records. No language interpreter was used.  Abdominal Pain  David Chapman is a 40 y.o. male who presents to the Emergency Department complaining of abdominal pain. He presents the emergency department complaining of left upper quadrant and epigastric abdominal pain that began last night. Pain is sharp and constant nature. It occasionally radiates to his right upper quadrant. He did drink alcohol yesterday and did not eat anything. He denies any fevers, nausea, vomiting, diarrhea. He does have a history of pancreatitis in 2019 and states that this feels similar.    Past Medical History:  Diagnosis Date  . Alcohol induced acute pancreatitis   . Back pain     There are no problems to display for this patient.   History reviewed. No pertinent surgical history.     History reviewed. No pertinent family history.  Social History   Tobacco Use  . Smoking status: Current Every Day Smoker    Types: Cigarettes  . Smokeless tobacco: Never Used  Substance Use Topics  . Alcohol use: Yes    Comment: occ  . Drug use: Not Currently    Types: Marijuana    Home Medications Prior to Admission medications   Medication Sig Start Date End Date Taking? Authorizing Provider  cyclobenzaprine (FLEXERIL) 10 MG tablet Take 1 tablet (10 mg total) by mouth 2 (two) times daily as needed for muscle spasms. 01/03/18   Khatri, Hina, PA-C  ibuprofen (ADVIL,MOTRIN) 800 MG tablet Take 1,600 mg by mouth every 8 (eight) hours as needed. For pain     [provider]  naproxen (NAPROSYN) 500 MG tablet Take 1 tablet (500 mg total) by mouth 2 (two) times daily. 01/03/18   Khatri, Hina, PA-C  omeprazole (PRILOSEC) 20 MG capsule Take 1  capsule (20 mg total) by mouth daily. 11/30/19   Tilden Fossa, MD  ondansetron (ZOFRAN ODT) 4 MG disintegrating tablet Take 1 tablet (4 mg total) by mouth every 8 (eight) hours as needed for nausea or vomiting. 11/30/19   Tilden Fossa, MD  ondansetron (ZOFRAN) 4 MG tablet Take 1 tablet (4 mg total) by mouth every 6 (six) hours. 02/17/18   Rancour, Jeannett Senior, MD  oxyCODONE (ROXICODONE) 5 MG immediate release tablet Take 1 tablet (5 mg total) by mouth every 4 (four) hours as needed for severe pain. 02/17/18   Rancour, Jeannett Senior, MD  oxyCODONE-acetaminophen (PERCOCET/ROXICET) 5-325 MG tablet Take 1 tablet by mouth every 6 (six) hours as needed for severe pain. 08/18/16   Cheri Fowler, PA-C  pantoprazole (PROTONIX) 20 MG tablet Take 1 tablet (20 mg total) by mouth daily. 11/03/17   Kirichenko, Tatyana, PA-C  ranitidine (ZANTAC) 150 MG capsule Take 1 capsule (150 mg total) by mouth daily. 02/17/18   Rancour, Jeannett Senior, MD  sucralfate (CARAFATE) 1 GM/10ML suspension Take 10 mLs (1 g total) by mouth 4 (four) times daily -  with meals and at bedtime. 11/30/19   Tilden Fossa, MD    Allergies    Penicillins  Review of Systems   Review of Systems  Gastrointestinal: Positive for abdominal pain.  All other systems reviewed and are negative.   Physical Exam Updated Vital Signs BP (!) 135/96 (BP Location: Left Arm)  Pulse 76   Temp 98.7 F (37.1 C) (Oral)   Resp 16   Ht 5\' 8"  (1.727 m)   Wt 73.5 kg   SpO2 100%   BMI 24.63 kg/m   Physical Exam Vitals and nursing note reviewed.  Constitutional:      Appearance: He is well-developed.  HENT:     Head: Normocephalic and atraumatic.  Cardiovascular:     Rate and Rhythm: Normal rate and regular rhythm.     Heart sounds: No murmur.  Pulmonary:     Effort: Pulmonary effort is normal. No respiratory distress.     Breath sounds: Normal breath sounds.  Abdominal:     Palpations: Abdomen is soft.     Tenderness: There is no guarding or rebound.      Comments: Moderate epigastric and left upper quadrant tenderness without guarding or rebound  Musculoskeletal:        General: No tenderness.  Skin:    General: Skin is warm and dry.  Neurological:     Mental Status: He is alert and oriented to person, place, and time.  Psychiatric:        Behavior: Behavior normal.     ED Results / Procedures / Treatments   Labs (all labs ordered are listed, but only abnormal results are displayed) Labs Reviewed  COMPREHENSIVE METABOLIC PANEL - Abnormal; Notable for the following components:      Result Value   Total Protein 8.2 (*)    All other components within normal limits  LIPASE, BLOOD  CBC WITH DIFFERENTIAL/PLATELET  URINALYSIS, ROUTINE W REFLEX MICROSCOPIC    EKG None  Radiology No results found.  Procedures Procedures (including critical care time)  Medications Ordered in ED Medications  sodium chloride 0.9 % bolus 1,000 mL (1,000 mLs Intravenous New Bag/Given 11/30/19 0958)  morphine 4 MG/ML injection 4 mg (4 mg Intravenous Given 11/30/19 1001)  ondansetron (ZOFRAN) injection 4 mg (4 mg Intravenous Given 11/30/19 1001)  famotidine (PEPCID) IVPB 20 mg premix (0 mg Intravenous Stopped 11/30/19 1131)  alum & mag hydroxide-simeth (MAALOX/MYLANTA) 200-200-20 MG/5ML suspension 30 mL (30 mLs Oral Given 11/30/19 1013)    And  lidocaine (XYLOCAINE) 2 % viscous mouth solution 15 mL (15 mLs Oral Given 11/30/19 1013)    ED Course  I have reviewed the triage vital signs and the nursing notes.  Pertinent labs & imaging results that were available during my care of the patient were reviewed by me and considered in my medical decision making (see chart for details).    MDM Rules/Calculators/A&P                     Patient with history of alcohol induced pancreatitis here for evaluation of recurrent abdominal pain since yesterday, did drink alcohol yesterday. He does have tenderness on examination without peritoneal findings. Labs without  significant electrolyte abnormality. No elevation in his lipase. Presentation is not consistent with acute pancreatitis, cholecystitis, perforated viscous. He is feeling partially improved on repeat assessment after medications. Discussed with patient home care for gastritis. Discussed outpatient follow-up and return precautions.  Final Clinical Impression(s) / ED Diagnoses Final diagnoses:  Acute gastritis without hemorrhage, unspecified gastritis type  Epigastric pain    Rx / DC Orders ED Discharge Orders         Ordered    omeprazole (PRILOSEC) 20 MG capsule  Daily     11/30/19 1140    sucralfate (CARAFATE) 1 GM/10ML suspension  3 times daily with meals &  bedtime     11/30/19 1140    ondansetron (ZOFRAN ODT) 4 MG disintegrating tablet  Every 8 hours PRN     11/30/19 1140           Quintella Reichert, MD 11/30/19 1147

## 2019-11-30 NOTE — ED Triage Notes (Signed)
Upper abdominal pain since last night.  No N/V.  Pt has history of pancreatitis.

## 2019-11-30 NOTE — ED Notes (Signed)
ED Provider at bedside. 

## 2019-11-30 NOTE — ED Notes (Signed)
Pt ambulated with IV pole to RR unassisted

## 2020-01-20 ENCOUNTER — Other Ambulatory Visit: Payer: Self-pay

## 2020-01-20 ENCOUNTER — Encounter (HOSPITAL_BASED_OUTPATIENT_CLINIC_OR_DEPARTMENT_OTHER): Payer: Self-pay | Admitting: Emergency Medicine

## 2020-01-20 ENCOUNTER — Emergency Department (HOSPITAL_BASED_OUTPATIENT_CLINIC_OR_DEPARTMENT_OTHER)
Admission: EM | Admit: 2020-01-20 | Discharge: 2020-01-20 | Disposition: A | Discharge status: Home / Self Care | Payer: Self-pay | Attending: Emergency Medicine | Admitting: Emergency Medicine

## 2020-01-20 DIAGNOSIS — K0889 Other specified disorders of teeth and supporting structures: Secondary | ICD-10-CM

## 2020-01-20 DIAGNOSIS — Z79899 Other long term (current) drug therapy: Secondary | ICD-10-CM | POA: Insufficient documentation

## 2020-01-20 DIAGNOSIS — K029 Dental caries, unspecified: Secondary | ICD-10-CM | POA: Insufficient documentation

## 2020-01-20 DIAGNOSIS — F17210 Nicotine dependence, cigarettes, uncomplicated: Secondary | ICD-10-CM | POA: Insufficient documentation

## 2020-01-20 MED ORDER — CLINDAMYCIN HCL 150 MG PO CAPS: 300.0000 mg | ORAL_CAPSULE | Freq: Three times a day (TID) | ORAL | 0 refills | Status: AC

## 2020-01-20 NOTE — ED Provider Notes (Signed)
[facility] EMERGENCY DEPARTMENT Provider Note   CSN: [id number] Arrival date & time: 01/20/20  2119     History Chief Complaint  Patient presents with  . Dental Pain    David Chapman is a 39 y.o. male.  HPI Patient planing of left lower dental pain.  He states he has also noticed some swelling.  He states he has had a broken tooth in that area for some time but he states that as of 2 days ago he has been having progressively worsening dental pain.  Denies any fevers or chills.  No nausea or vomiting.  He states he does not have a dentist.  He denies any aggravating mitigating factors.  He states he took some ibuprofen which did not help.      Past Medical History:  Diagnosis Date  . Alcohol induced acute pancreatitis   . Back pain     There are no problems to display for this patient.   History reviewed. No pertinent surgical history.     No family history on file.  Social History   Tobacco Use  . Smoking status: Current Every Day Smoker    Types: Cigarettes  . Smokeless tobacco: Never Used  Vaping Use  . Vaping Use: Never used  Substance Use Topics  . Alcohol use: Yes    Comment: occ  . Drug use: Not Currently    Types: Marijuana    Home Medications Prior to Admission medications   Medication Sig Start Date End Date Taking? Authorizing Provider  clindamycin (CLEOCIN) 150 MG capsule Take 2 capsules (300 mg total) by mouth 3 (three) times daily for 7 days. 01/20/20 01/27/20  Tedd Sias, PA  cyclobenzaprine (FLEXERIL) 10 MG tablet Take 1 tablet (10 mg total) by mouth 2 (two) times daily as needed for muscle spasms. 01/03/18   Khatri, Hina, PA-C  ibuprofen (ADVIL,MOTRIN) 800 MG tablet Take 1,600 mg by mouth every 8 (eight) hours as needed. For pain     [provider]  naproxen (NAPROSYN) 500 MG tablet Take 1 tablet (500 mg total) by mouth 2 (two) times daily. 01/03/18   Khatri, Hina, PA-C  omeprazole (PRILOSEC) 20 MG capsule Take 1 capsule  (20 mg total) by mouth daily. 11/30/19   Quintella Reichert, MD  ondansetron (ZOFRAN ODT) 4 MG disintegrating tablet Take 1 tablet (4 mg total) by mouth every 8 (eight) hours as needed for nausea or vomiting. 11/30/19   Quintella Reichert, MD  ondansetron (ZOFRAN) 4 MG tablet Take 1 tablet (4 mg total) by mouth every 6 (six) hours. 02/17/18   Rancour, Annie Main, MD  oxyCODONE (ROXICODONE) 5 MG immediate release tablet Take 1 tablet (5 mg total) by mouth every 4 (four) hours as needed for severe pain. 02/17/18   Rancour, Annie Main, MD  oxyCODONE-acetaminophen (PERCOCET/ROXICET) 5-325 MG tablet Take 1 tablet by mouth every 6 (six) hours as needed for severe pain. 08/18/16   Gloriann Loan, PA-C  pantoprazole (PROTONIX) 20 MG tablet Take 1 tablet (20 mg total) by mouth daily. 11/03/17   Kirichenko, Tatyana, PA-C  ranitidine (ZANTAC) 150 MG capsule Take 1 capsule (150 mg total) by mouth daily. 02/17/18   Rancour, Annie Main, MD  sucralfate (CARAFATE) 1 GM/10ML suspension Take 10 mLs (1 g total) by mouth 4 (four) times daily -  with meals and at bedtime. 11/30/19   Quintella Reichert, MD    Allergies    Penicillins  Review of Systems   Review of Systems  Constitutional: Negative for chills  and fever.  HENT: Positive for dental problem. Negative for congestion.   Respiratory: Negative for shortness of breath.   Cardiovascular: Negative for chest pain.  Gastrointestinal: Negative for abdominal pain.  Musculoskeletal: Negative for neck pain.    Physical Exam Updated Vital Signs BP 121/89 (BP Location: Right Arm)   Pulse 87   Temp 98.7 F (37.1 C) (Oral)   Resp 16   Ht 5\' 9"  (1.753 m)   Wt 73.5 kg   SpO2 99%   BMI 23.92 kg/m   Physical Exam Vitals and nursing note reviewed.  Constitutional:      General: He is not in acute distress.    Appearance: Normal appearance. He is not ill-appearing.  HENT:     Head: Normocephalic and atraumatic.     Mouth/Throat:      Comments: Diffuse forced injection.  Full range  of motion of tongue. Eyes:     General: No scleral icterus.       Right eye: No discharge.        Left eye: No discharge.     Conjunctiva/sclera: Conjunctivae normal.  Pulmonary:     Effort: Pulmonary effort is normal.     Breath sounds: No stridor.  Neurological:     Mental Status: He is alert and oriented to person, place, and time. Mental status is at baseline.     ED Results / Procedures / Treatments   Labs (all labs ordered are listed, but only abnormal results are displayed) Labs Reviewed - No data to display  EKG None  Radiology No results found.  Procedures Procedures (including critical care time)  Medications Ordered in ED Medications - No data to display  ED Course  I have reviewed the triage vital signs and the nursing notes.  Pertinent labs & imaging results that were available during my care of the patient were reviewed by me and considered in my medical decision making (see chart for details).    MDM Rules/Calculators/A&P                          Patient with broken tooth and swollen gumline with tenderness with percussion of tooth stump.  Swelling does appear indicative of infection.  No purulence present.  No lancable abscess.  Concern for dental infection.  Will provide patient with clindamycin as he has a penicillin allergy.  He will follow-up with of dentistry.  Patient given return precautions.  He is afebrile well-appearing at this time.  Patient has no trismus, has full range of motion of tongue.  Doubt Ludwick's, PTA, RPA, deep space infection.  Final Clinical Impression(s) / ED Diagnoses Final diagnoses:  Dental caries  Pain, dental    Rx / DC Orders ED Discharge Orders         Ordered    clindamycin (CLEOCIN) 150 MG capsule  3 times daily     Discontinue  Reprint     01/20/20 1114           01/22/20 [place], DOLE 01/20/20 1118    01/22/20, MD 01/21/20 2204

## 2020-01-20 NOTE — Discharge Instructions (Addendum)
Please use Tylenol or ibuprofen for pain.  You may use 600 mg ibuprofen every 6 hours or 1000 mg of Tylenol every 6 hours.  You may choose to alternate between the 2.  This would be most effective.  Not to exceed 4 g of Tylenol within 24 hours.  Not to exceed 3200 mg ibuprofen 24 hours.  

## 2020-01-20 NOTE — ED Triage Notes (Signed)
L lower dental pain. He has a broken tooth.

## 2020-02-25 IMAGING — CT CT ABD-PELV W/ CM
2 of 4 series · 16 of 46 positions shown, 18 images · IV contrast (iopamidol)
Comparison: CT of the abdomen pelvis dated 02/24/2017

CLINICAL DATA: 36-year-old male with abdominal pain. Concern for
acute pancreatitis.

EXAM:
CT ABDOMEN AND PELVIS WITH CONTRAST
TECHNIQUE: Multidetector CT imaging of the abdomen and pelvis was performed
using the standard protocol following bolus administration of
intravenous contrast.
CONTRAST:  100mL [id number] IOPAMIDOL ([id number]) INJECTION 61%

[Series 2: axial st · axial · 0.74mm/px · z∈[-466,+24]mm · 13 of 108 slices shown, 15 images]
[im 5/108  soft-tissue]
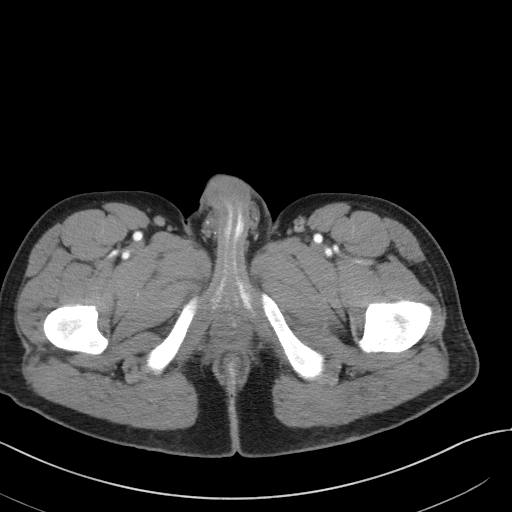
[im 5/108  bone]
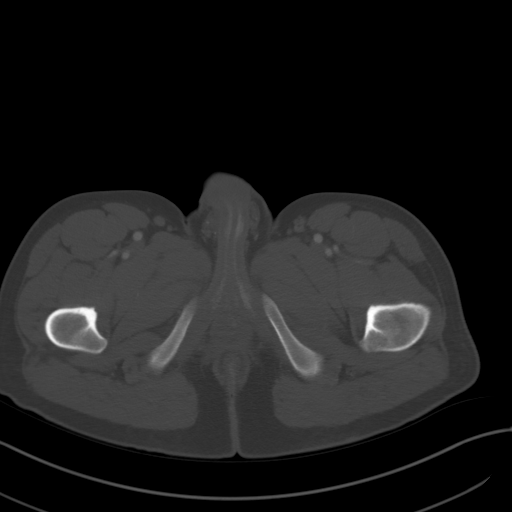
[im 14/108  soft-tissue]
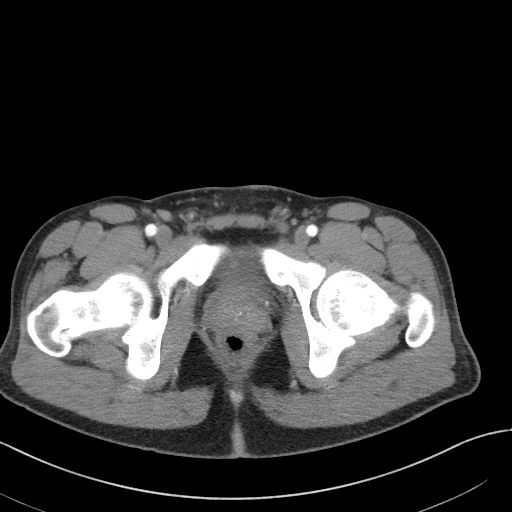
[im 23/108  soft-tissue]
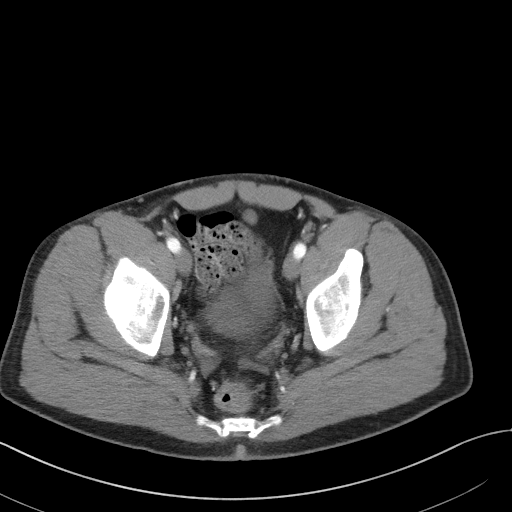
[im 32/108  soft-tissue]
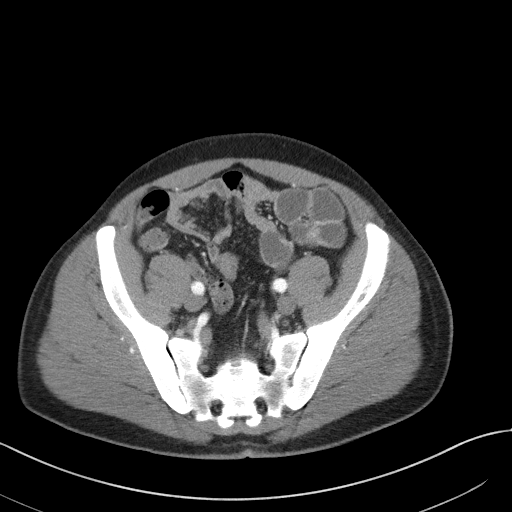
[im 36/108  soft-tissue]
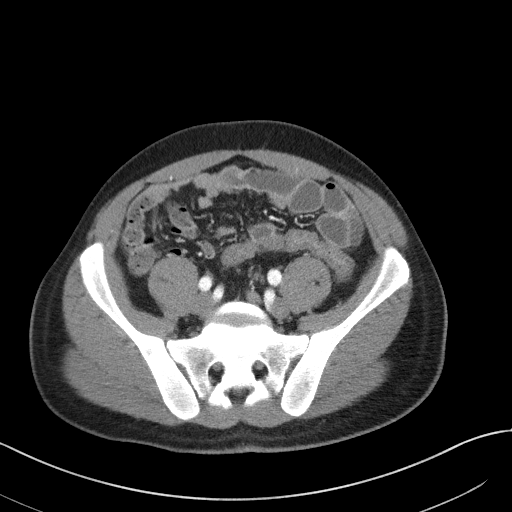
[im 45/108  soft-tissue]
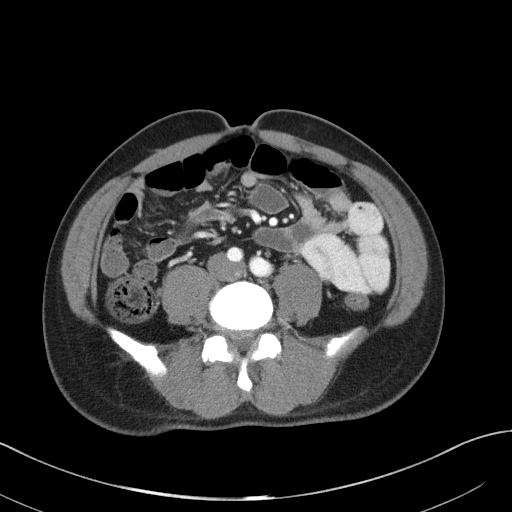
[im 54/108  soft-tissue]
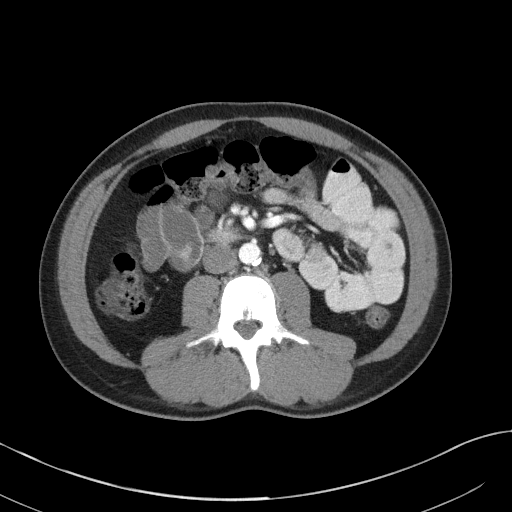
[im 63/108  soft-tissue]
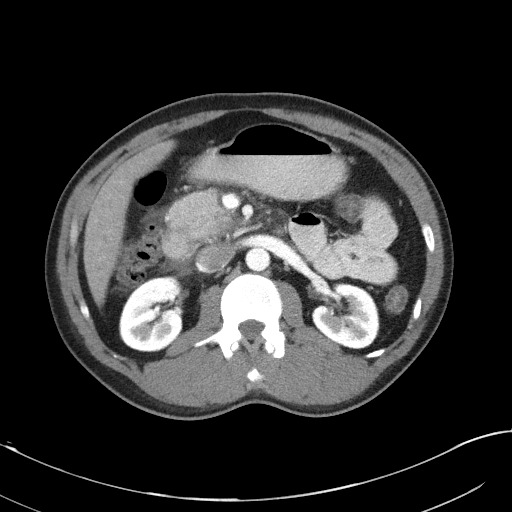
[im 72/108  soft-tissue]
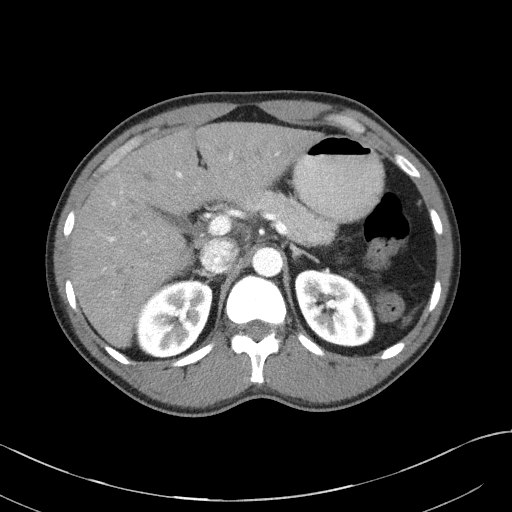
[im 72/108  bone]
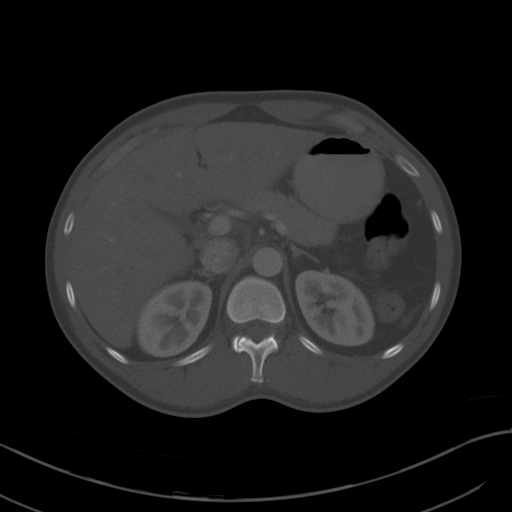
[im 76/108  soft-tissue]
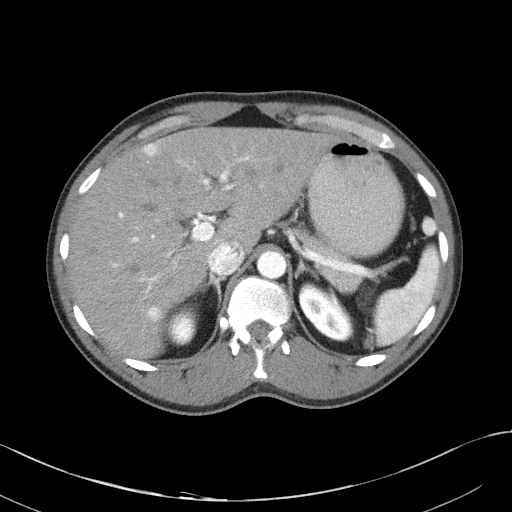
[im 85/108  soft-tissue]
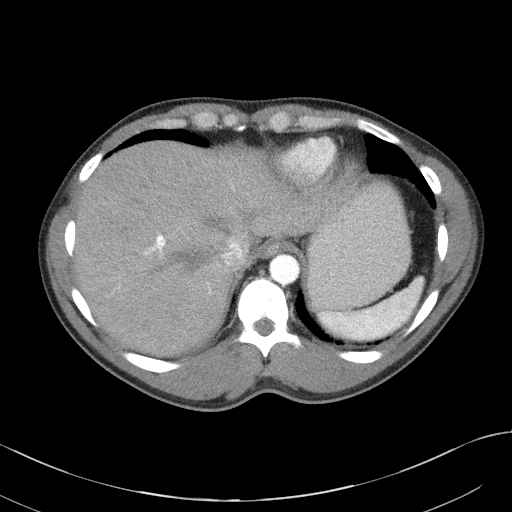
[im 94/108  soft-tissue]
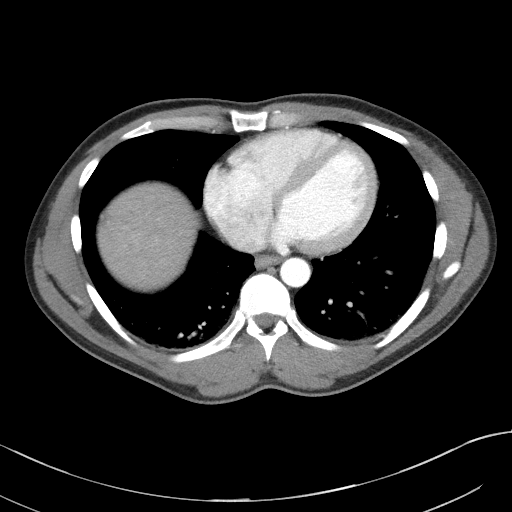
[im 103/108  soft-tissue]
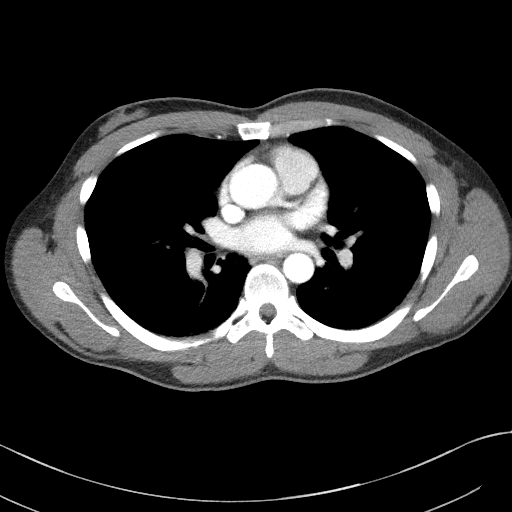

[Series 5: coronal st · coronal · 0.82mm/px · 3 of 79 slices shown]
[im 27/79  soft-tissue]
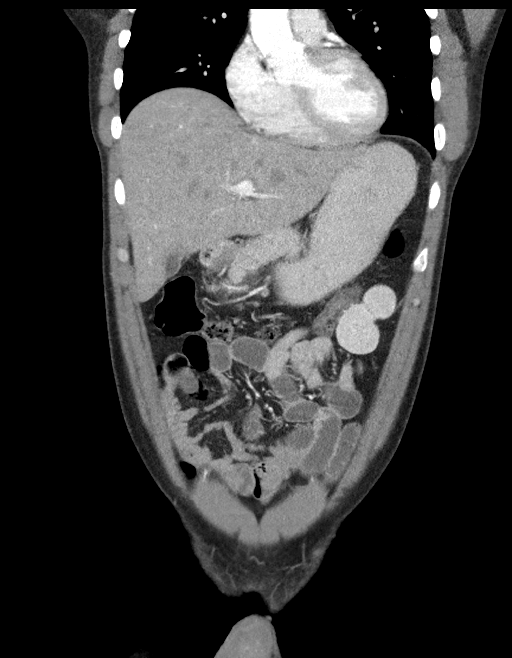
[im 35/79  soft-tissue]
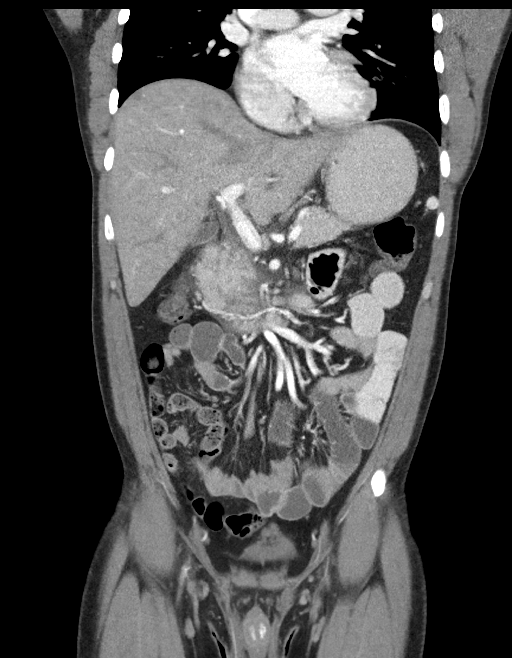
[im 44/79  soft-tissue]
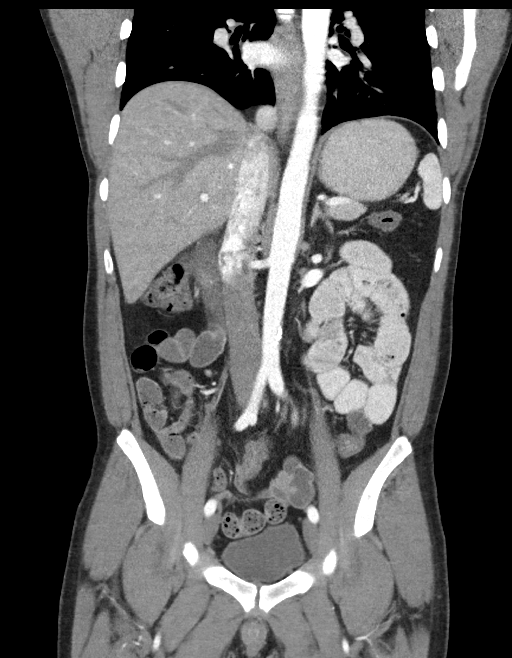

[16 of 46 positions shown; findings below may reference images not displayed]

FINDINGS: Lower chest: Minimal bibasilar atelectatic changes. The visualized
lung bases are otherwise clear.

No intra-abdominal free air.  Small free fluid within the pelvis.

Hepatobiliary: A 1.5 x 2 cm hyperenhancing focus in the anterior
liver similar to prior CT is not well characterized but may
represent a flash filling hemangioma or portal venous shunting.
Further evaluation with MRI on a nonemergent basis as previously
suggested. No intrahepatic biliary ductal dilatation. The
gallbladder is unremarkable.

Pancreas: There is inflammatory changes of the head and uncinate
process of the pancreas consistent with acute pancreatitis. No
drainable fluid collection, abscess, or pseudocyst.

Spleen: Normal in size without focal abnormality.

Adrenals/Urinary Tract: Adrenal glands are unremarkable. Kidneys are
normal, without renal calculi, focal lesion, or hydronephrosis.
Bladder is unremarkable.

Stomach/Bowel: There is no bowel obstruction or active inflammation.
Normal caliber fecalized distal small bowel loops may represent
increased transit time or small intestine bacterial overgrowth.
Clinical correlation is recommended. The appendix is normal.

Vascular/Lymphatic: No significant vascular findings are present. No
enlarged abdominal or pelvic lymph nodes.

Reproductive: The prostate and seminal vesicles are grossly
unremarkable.

Other: None

Musculoskeletal: No acute or significant osseous findings.
IMPRESSION: 1. Acute pancreatitis.  No abscess or pseudocyst.
2. Enhancing lesion in the anterior liver similar to prior CT may
represent a flash filling hemangioma or portal venous shunting.
Further evaluation with MRI on a nonemergent basis recommended.

## 2021-02-27 ENCOUNTER — Emergency Department (HOSPITAL_BASED_OUTPATIENT_CLINIC_OR_DEPARTMENT_OTHER)
Admission: EM | Admit: 2021-02-27 | Discharge: 2021-02-27 | Disposition: A | Discharge status: Home / Self Care | Payer: Self-pay | Attending: Emergency Medicine | Admitting: Emergency Medicine

## 2021-02-27 ENCOUNTER — Other Ambulatory Visit: Payer: Self-pay

## 2021-02-27 ENCOUNTER — Encounter (HOSPITAL_BASED_OUTPATIENT_CLINIC_OR_DEPARTMENT_OTHER): Payer: Self-pay | Admitting: *Deleted

## 2021-02-27 DIAGNOSIS — K0889 Other specified disorders of teeth and supporting structures: Secondary | ICD-10-CM | POA: Insufficient documentation

## 2021-02-27 DIAGNOSIS — Z5321 Procedure and treatment not carried out due to patient leaving prior to being seen by health care provider: Secondary | ICD-10-CM | POA: Insufficient documentation

## 2021-02-27 NOTE — ED Triage Notes (Signed)
Dental pain since last night

## 2021-07-22 ENCOUNTER — Encounter (HOSPITAL_BASED_OUTPATIENT_CLINIC_OR_DEPARTMENT_OTHER): Payer: Self-pay | Admitting: *Deleted

## 2021-07-22 ENCOUNTER — Emergency Department (HOSPITAL_BASED_OUTPATIENT_CLINIC_OR_DEPARTMENT_OTHER)
Admission: EM | Admit: 2021-07-22 | Discharge: 2021-07-22 | Disposition: A | Discharge status: Home / Self Care | Payer: Self-pay | Attending: Emergency Medicine | Admitting: Emergency Medicine

## 2021-07-22 ENCOUNTER — Other Ambulatory Visit: Payer: Self-pay

## 2021-07-22 DIAGNOSIS — R051 Acute cough: Secondary | ICD-10-CM

## 2021-07-22 DIAGNOSIS — R059 Cough, unspecified: Secondary | ICD-10-CM | POA: Insufficient documentation

## 2021-07-22 DIAGNOSIS — Z20822 Contact with and (suspected) exposure to covid-19: Secondary | ICD-10-CM | POA: Insufficient documentation

## 2021-07-22 DIAGNOSIS — F17210 Nicotine dependence, cigarettes, uncomplicated: Secondary | ICD-10-CM | POA: Insufficient documentation

## 2021-07-22 LAB — RESP PANEL BY RT-PCR (FLU A&B, COVID) ARPGX2
Influenza A by PCR: NEGATIVE
Influenza B by PCR: NEGATIVE
SARS Coronavirus 2 by RT PCR: NEGATIVE

## 2021-07-22 MED ORDER — ALBUTEROL SULFATE HFA 108 (90 BASE) MCG/ACT IN AERS
2.0000 | INHALATION_SPRAY | Freq: Once | RESPIRATORY_TRACT | Status: AC
  Administered 2021-07-22: 17:00:00 2 via RESPIRATORY_TRACT
  Filled 2021-07-22: qty 6.7

## 2021-07-22 MED ORDER — BENZONATATE 100 MG PO CAPS: 100.0000 mg | ORAL_CAPSULE | Freq: Three times a day (TID) | ORAL | 0 refills | Status: DC

## 2021-07-22 MED ORDER — BENZONATATE 100 MG PO CAPS
100.0000 mg | ORAL_CAPSULE | Freq: Once | ORAL | Status: AC
  Administered 2021-07-22: 16:00:00 100 mg via ORAL
  Filled 2021-07-22: qty 1

## 2021-07-22 NOTE — ED Triage Notes (Signed)
Pt reports "strong" cough for several days

## 2021-07-22 NOTE — ED Provider Notes (Signed)
[facility] EMERGENCY DEPARTMENT Provider Note   CSN: [id number] Arrival date & time: 07/22/21  1523     History Chief Complaint  Patient presents with   Cough    David Chapman is a 40 y.o. male.  Patient with no significant past medical history presents to the emergency department for cough over the past 3 days.  He has tried Mucinex and over-the-counter cough and cold medication without improvement.  Cough is nonproductive.  He denies associated fever, chills, ear pain, runny nose or sore throat.  No known sick contacts.  States that he was recently out in the rain without a coat on.  No vomiting or diarrhea.      Past Medical History:  Diagnosis Date   Alcohol induced acute pancreatitis    Back pain     There are no problems to display for this patient.   History reviewed. No pertinent surgical history.     No family history on file.  Social History   Tobacco Use   Smoking status: Every Day    Types: Cigarettes   Smokeless tobacco: Never  Vaping Use   Vaping Use: Never used  Substance Use Topics   Alcohol use: Yes    Comment: occ   Drug use: Not Currently    Types: Marijuana    Home Medications Prior to Admission medications   Medication Sig Start Date End Date Taking? Authorizing Provider  cyclobenzaprine (FLEXERIL) 10 MG tablet Take 1 tablet (10 mg total) by mouth 2 (two) times daily as needed for muscle spasms. 01/03/18   Khatri, Hina, PA-C  ibuprofen (ADVIL,MOTRIN) 800 MG tablet Take 1,600 mg by mouth every 8 (eight) hours as needed. For pain     [provider]  naproxen (NAPROSYN) 500 MG tablet Take 1 tablet (500 mg total) by mouth 2 (two) times daily. 01/03/18   Khatri, Hina, PA-C  omeprazole (PRILOSEC) 20 MG capsule Take 1 capsule (20 mg total) by mouth daily. 11/30/19   Quintella Reichert, MD  ondansetron (ZOFRAN ODT) 4 MG disintegrating tablet Take 1 tablet (4 mg total) by mouth every 8 (eight) hours as needed for nausea or vomiting.  11/30/19   Quintella Reichert, MD  ondansetron (ZOFRAN) 4 MG tablet Take 1 tablet (4 mg total) by mouth every 6 (six) hours. 02/17/18   Rancour, Annie Main, MD  oxyCODONE (ROXICODONE) 5 MG immediate release tablet Take 1 tablet (5 mg total) by mouth every 4 (four) hours as needed for severe pain. 02/17/18   Rancour, Annie Main, MD  oxyCODONE-acetaminophen (PERCOCET/ROXICET) 5-325 MG tablet Take 1 tablet by mouth every 6 (six) hours as needed for severe pain. 08/18/16   Gloriann Loan, PA-C  pantoprazole (PROTONIX) 20 MG tablet Take 1 tablet (20 mg total) by mouth daily. 11/03/17   Kirichenko, Tatyana, PA-C  ranitidine (ZANTAC) 150 MG capsule Take 1 capsule (150 mg total) by mouth daily. 02/17/18   Rancour, Annie Main, MD  sucralfate (CARAFATE) 1 GM/10ML suspension Take 10 mLs (1 g total) by mouth 4 (four) times daily -  with meals and at bedtime. 11/30/19   Quintella Reichert, MD    Allergies    Penicillins  Review of Systems   Review of Systems  Constitutional:  Negative for fever.  HENT:  Negative for rhinorrhea and sore throat.   Eyes:  Negative for redness.  Respiratory:  Positive for cough and chest tightness. Negative for shortness of breath and wheezing.   Cardiovascular:  Negative for chest pain.  Gastrointestinal:  Negative for  abdominal pain, diarrhea, nausea and vomiting.  Genitourinary:  Negative for dysuria and hematuria.  Musculoskeletal:  Negative for myalgias.  Skin:  Negative for rash.  Neurological:  Negative for headaches.   Physical Exam Updated Vital Signs BP (!) 136/93 (BP Location: Left Arm)    Pulse 85    Temp 98.1 F (36.7 C) (Oral)    Resp 18    Ht 5\' 9"  (1.753 m)    Wt 74.8 kg    SpO2 99%    BMI 24.37 kg/m   Physical Exam Vitals and nursing note reviewed.  Constitutional:      Appearance: He is well-developed.  HENT:     Head: Normocephalic and atraumatic.     Jaw: No trismus.     Right Ear: Tympanic membrane, ear canal and external ear normal.     Left Ear: Tympanic membrane,  ear canal and external ear normal.     Nose: Nose normal. No mucosal edema or rhinorrhea.     Mouth/Throat:     Mouth: Mucous membranes are not dry.     Pharynx: Uvula midline. No oropharyngeal exudate, posterior oropharyngeal erythema or uvula swelling.     Tonsils: No tonsillar abscesses.  Eyes:     General:        Right eye: No discharge.        Left eye: No discharge.     Conjunctiva/sclera: Conjunctivae normal.  Cardiovascular:     Rate and Rhythm: Normal rate and regular rhythm.     Heart sounds: Normal heart sounds.  Pulmonary:     Effort: Pulmonary effort is normal. No respiratory distress.     Breath sounds: Normal breath sounds. No wheezing or rales.  Abdominal:     Palpations: Abdomen is soft.     Tenderness: There is no abdominal tenderness.  Musculoskeletal:     Cervical back: Normal range of motion and neck supple.  Skin:    General: Skin is warm and dry.  Neurological:     Mental Status: He is alert.    ED Results / Procedures / Treatments   Labs (all labs ordered are listed, but only abnormal results are displayed) Labs Reviewed  RESP PANEL BY RT-PCR (FLU A&B, COVID) [id number]    EKG None  Radiology No results found.  Procedures Procedures   Medications Ordered in ED Medications  benzonatate (TESSALON) capsule 100 mg (has no administration in time range)  albuterol (VENTOLIN HFA) 108 (90 Base) MCG/ACT inhaler 2 puff (has no administration in time range)    ED Course  I have reviewed the triage vital signs and the nursing notes.  Pertinent labs & imaging results that were available during my care of the patient were reviewed by me and considered in my medical decision making (see chart for details).  Patient seen and examined. Plan discussed with patient.   Labs: Flu and COVID test  Imaging: None  Medications/Fluids: Tessalon and albuterol inhaler  Vital signs reviewed and are as follows: BP (!) 136/93 (BP Location: Left Arm)    Pulse 85     Temp 98.1 F (36.7 C) (Oral)    Resp 18    Ht 5\' 9"  (1.753 m)    Wt 74.8 kg    SpO2 99%    BMI 24.37 kg/m   Initial impression: Bronchitis  4:52 PM On re-exam patient appears comfortable, no distress.  Discussed pertinent results with patient at bedside. This included neg flu, covid test. They are comfortable with discharge at  this time.   Home treatment: Prescription written for tessalon.  Discussed use of albuterol inhaler for symptoms.    Patient counseled on supportive care for viral URI.  Urged to see PCP if symptoms persist for more than 3 days. Discussed s/s to return including worsening symptoms, persistent fever, persistent vomiting, or if they have any other concerns. Patient verbalizes understanding and agrees with plan.      MDM Rules/Calculators/A&P                         Patient here with cough, no distress.  Flu/covid neg. Patient with symptoms consistent with a viral syndrome. Vitals are stable, no fever. No signs of dehydration. Lung exam normal, no signs of pneumonia. Supportive therapy indicated with return if symptoms worsen.       Final Clinical Impression(s) / ED Diagnoses Final diagnoses:  None    Rx / DC Orders ED Discharge Orders     None        Carlisle Cater, PA-C 07/22/21 1653    Blanchie Dessert, MD 07/22/21 2251

## 2021-07-22 NOTE — Discharge Instructions (Signed)
Please read and follow all provided instructions.  Your diagnoses today include:  1. Acute cough     You appear to have an upper respiratory infection (URI). An upper respiratory tract infection, or cold, is a viral infection of the air passages leading to the lungs. It should improve gradually after 5-7 days. You may have a lingering cough that lasts for 2- 4 weeks after the infection.  Tests performed today include: Vital signs. See below for your results today.  COVID, flu testing: Negative  Medications prescribed:  Tessalon Perles - cough suppressant medication  Albuterol inhaler - medication that opens up your airway  Use inhaler as follows: 1-2 puffs with spacer every 4 hours as needed for wheezing, cough, or shortness of breath.   Take any prescribed medications only as directed. Treatment for your infection is aimed at treating the symptoms. There are no medications, such as antibiotics, that will cure your infection.   Home care instructions:  You can take Tylenol and/or Ibuprofen as directed on the packaging for fever reduction and pain relief.    For cough: honey 1/2 to 1 teaspoon (you can dilute the honey in water or another fluid).  You can also use guaifenesin and dextromethorphan for cough. You can use a humidifier for chest congestion and cough.  If you don't have a humidifier, you can sit in the bathroom with the hot shower running.      For sore throat: try warm salt water gargles, cepacol lozenges, throat spray, warm tea or water with lemon/honey, popsicles or ice, or OTC cold relief medicine for throat discomfort.    For congestion: take a daily anti-histamine like Zyrtec, Claritin, and a oral decongestant, such as pseudoephedrine.  You can also use Flonase 1-2 sprays in each nostril daily.    It is important to stay hydrated: drink plenty of fluids (water, gatorade/powerade/pedialyte, juices, or teas) to keep your throat moisturized and help further relieve  irritation/discomfort.   Your illness is contagious and can be spread to others, especially during the first 3 or 4 days. It cannot be cured by antibiotics or other medicines. Take basic precautions such as washing your hands often, covering your mouth when you cough or sneeze, and avoiding public places where you could spread your illness to others.   Please continue drinking plenty of fluids.  Use over-the-counter medicines as needed as directed on packaging for symptom relief.  You may also use ibuprofen or tylenol as directed on packaging for pain or fever.  Do not take multiple medicines containing Tylenol or acetaminophen to avoid taking too much of this medication.  Follow-up instructions: Please follow-up with your primary care provider in the next 3 days for further evaluation of your symptoms if you are not feeling better.   Return instructions:  Please return to the Emergency Department if you experience worsening symptoms.  RETURN IMMEDIATELY IF you develop shortness of breath, confusion or altered mental status, a new rash, become dizzy, faint, or poorly responsive, or are unable to be cared for at home. Please return if you have persistent vomiting and cannot keep down fluids or develop a fever that is not controlled by tylenol or motrin.   Please return if you have any other emergent concerns.  Additional Information:  Your vital signs today were: BP (!) 136/93 (BP Location: Left Arm)    Pulse 85    Temp 98.1 F (36.7 C) (Oral)    Resp 18    Ht 5\' 9"  (1.753  m)    Wt 74.8 kg    SpO2 100%    BMI 24.37 kg/m  If your blood pressure (BP) was elevated above 135/85 this visit, please have this repeated by your doctor within one month. --------------

## 2021-12-15 ENCOUNTER — Emergency Department (HOSPITAL_BASED_OUTPATIENT_CLINIC_OR_DEPARTMENT_OTHER)
Admission: EM | Admit: 2021-12-15 | Discharge: 2021-12-15 | Disposition: A | Discharge status: Home / Self Care | Payer: Self-pay | Attending: Emergency Medicine | Admitting: Emergency Medicine

## 2021-12-15 ENCOUNTER — Encounter (HOSPITAL_BASED_OUTPATIENT_CLINIC_OR_DEPARTMENT_OTHER): Payer: Self-pay | Admitting: Emergency Medicine

## 2021-12-15 ENCOUNTER — Emergency Department (HOSPITAL_BASED_OUTPATIENT_CLINIC_OR_DEPARTMENT_OTHER): Payer: Self-pay

## 2021-12-15 ENCOUNTER — Other Ambulatory Visit: Payer: Self-pay

## 2021-12-15 DIAGNOSIS — S9032XA Contusion of left foot, initial encounter: Secondary | ICD-10-CM | POA: Insufficient documentation

## 2021-12-15 DIAGNOSIS — W208XXA Other cause of strike by thrown, projected or falling object, initial encounter: Secondary | ICD-10-CM | POA: Insufficient documentation

## 2021-12-15 MED ORDER — CLINDAMYCIN HCL 300 MG PO CAPS: 300.0000 mg | ORAL_CAPSULE | Freq: Three times a day (TID) | ORAL | 0 refills | Status: AC

## 2021-12-15 MED ORDER — DICLOFENAC SODIUM 75 MG PO TBEC: 75.0000 mg | DELAYED_RELEASE_TABLET | Freq: Two times a day (BID) | ORAL | 0 refills | Status: AC

## 2021-12-15 NOTE — ED Notes (Signed)
Pt presents with left toe pain especially digits 2-4. Pt stated he accidentally dropped a heavy speaker on the lower part of his foot. Foot appears normal no swelling or abnormalities noted.  ?Pt also stated he has tooth pain. Pt stated he has a molar that was pulled but parts of the tooth was left. Tooth is on left side lower jaw. Pt stated his tooth has been hurting for the past several days. No swelling noticed to jaw. Gum  however did appear red and puffy.  ?

## 2021-12-15 NOTE — Discharge Instructions (Addendum)
Schedule to see the dentist for evaltuion  ?

## 2021-12-15 NOTE — ED Triage Notes (Signed)
Pt arrives pov, slow gait with c/o left foot and toe pain after dropping speaker on foot last night.  ?

## 2021-12-15 NOTE — ED Provider Notes (Signed)
?[facility] EMERGENCY DEPARTMENT ?Provider Note ? ? ?CSN: [id number] ?Arrival date & time: 12/15/21  1425 ? ?  ? ?History ? ?Chief Complaint  ?Patient presents with  ? Foot Injury  ? ? ?David Chapman is a 41 y.o. male. ? ?Pt reports he dropped a speaker on his foot.  Pt also complains of pain to mouth from a broken tooth.  ? ?The history is provided by the patient. No language interpreter was used.  ?Foot Injury ?Location:  Foot ?Injury: yes   ?Foot location:  L foot ?Pain details:  ?  Quality:  Aching ?  Severity:  Moderate ?  Timing:  Constant ?  Progression:  Worsening ?Dislocation: no   ?Foreign body present:  No foreign bodies ?Relieved by:  Nothing ? ?  ? ?Home Medications ?Prior to Admission medications   ?Medication Sig Start Date End Date Taking? Authorizing Provider  ?benzonatate (TESSALON) 100 MG capsule Take 1 capsule (100 mg total) by mouth every 8 (eight) hours. 07/22/21   Renne Crigler, PA-C  ?ibuprofen (ADVIL,MOTRIN) 800 MG tablet Take 1,600 mg by mouth every 8 (eight) hours as needed. For pain     [provider]  ?ranitidine (ZANTAC) 150 MG capsule Take 1 capsule (150 mg total) by mouth daily. 02/17/18   Rancour, Jeannett Senior, MD  ?sucralfate (CARAFATE) 1 GM/10ML suspension Take 10 mLs (1 g total) by mouth 4 (four) times daily -  with meals and at bedtime. 11/30/19   Tilden Fossa, MD  ?   ? ?Allergies    ?Penicillins   ? ?Review of Systems   ?Review of Systems  ?Musculoskeletal:  Positive for myalgias.  ?Skin:  Negative for color change and wound.  ?All other systems reviewed and are negative. ? ?Physical Exam ?Updated Vital Signs ?BP 118/79 (BP Location: Left Arm)   Pulse 93   Temp 97.9 ?F (36.6 ?C) (Oral)   Resp 17   Ht 5\' 9"  (1.753 m)   Wt 76.2 kg   SpO2 100%   BMI 24.81 kg/m?  ?Physical Exam ?Vitals reviewed.  ?Constitutional:   ?   Appearance: Normal appearance.  ?HENT:  ?   Head: Normocephalic.  ?   Nose: Nose normal.  ?   Mouth/Throat:  ?   Mouth: Mucous membranes are  moist.  ?   Comments: Severe decay lower mouth, teeth broken down to gums  ?Musculoskeletal:     ?   General: Swelling and tenderness present.  ?   Comments: Tender 2nd 3rd, 4th toes,  from  nv and ns intact   ?Skin: ?   General: Skin is warm.  ?Neurological:  ?   General: No focal deficit present.  ?   Mental Status: He is alert.  ?Psychiatric:     ?   Mood and Affect: Mood normal.  ? ? ?ED Results / Procedures / Treatments   ?Labs ?(all labs ordered are listed, but only abnormal results are displayed) ?Labs Reviewed - No data to display ? ?EKG ?None ? ?Radiology ?DG Foot Complete Left ? ?Result Date: 12/15/2021 ?CLINICAL DATA:  Trauma, pain EXAM: LEFT FOOT - COMPLETE 3+ VIEW COMPARISON:  None Available. FINDINGS: There is no evidence of fracture or dislocation. There is no evidence of arthropathy or other focal bone abnormality. Soft tissues are unremarkable. IMPRESSION: No fracture or dislocation is seen in the left foot. Electronically Signed   By: 12/17/2021 M.D.   On: 12/15/2021 14:57   ? ?Procedures ?Procedures  ? ? ?Medications  Ordered in ED ?Medications - No data to display ? ?ED Course/ Medical Decision Making/ A&P ?  ?                        ?Medical Decision Making ?Amount and/or Complexity of Data Reviewed ?Radiology: ordered and independent interpretation performed. Decision-making details documented in ED Course. ?   Details: xray left foot  no fracture ? ? ?Pt advised ice and elevation  Pt given rx for clindamycin and voltaren for dental infection.  Pt advised to follow up with dentist for recheck  ? ? ? ? ? ? ? ?Final Clinical Impression(s) / ED Diagnoses ?Final diagnoses:  ?Contusion of left foot, initial encounter  ? ? ?Rx / DC Orders ?ED Discharge Orders   ? ?      Ordered  ?  diclofenac (VOLTAREN) 75 MG EC tablet  2 times daily       ? 12/15/21 1518  ?  clindamycin (CLEOCIN) 300 MG capsule  3 times daily       ? 12/15/21 1518  ? ?  ?  ? ?  ?An After Visit Summary was printed and given to  the patient. ? ? ?  ?Elson Areas, [place] ?12/15/21 1519 ? ?  ?Ernie Avena, MD ?12/15/21 1554 ? ?

## 2022-01-01 ENCOUNTER — Other Ambulatory Visit: Payer: Self-pay

## 2022-01-01 ENCOUNTER — Encounter (HOSPITAL_BASED_OUTPATIENT_CLINIC_OR_DEPARTMENT_OTHER): Payer: Self-pay | Admitting: Emergency Medicine

## 2022-01-01 ENCOUNTER — Inpatient Hospital Stay (HOSPITAL_BASED_OUTPATIENT_CLINIC_OR_DEPARTMENT_OTHER)
Admission: EM | Admit: 2022-01-01 | Discharge: 2022-01-03 | DRG: 440 | Discharge status: Against Medical Advice | Payer: 59 | Attending: Internal Medicine | Admitting: Internal Medicine

## 2022-01-01 DIAGNOSIS — K859 Acute pancreatitis without necrosis or infection, unspecified: Secondary | ICD-10-CM | POA: Diagnosis present

## 2022-01-01 DIAGNOSIS — K852 Alcohol induced acute pancreatitis without necrosis or infection: Principal | ICD-10-CM | POA: Diagnosis present

## 2022-01-01 DIAGNOSIS — Z5329 Procedure and treatment not carried out because of patient's decision for other reasons: Secondary | ICD-10-CM | POA: Diagnosis present

## 2022-01-01 DIAGNOSIS — Z79899 Other long term (current) drug therapy: Secondary | ICD-10-CM

## 2022-01-01 DIAGNOSIS — F1010 Alcohol abuse, uncomplicated: Secondary | ICD-10-CM | POA: Diagnosis present

## 2022-01-01 DIAGNOSIS — F17210 Nicotine dependence, cigarettes, uncomplicated: Secondary | ICD-10-CM | POA: Diagnosis present

## 2022-01-01 NOTE — ED Triage Notes (Signed)
Patient states he has pancreatitis and c/o pain since last night.
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
## 2022-01-02 ENCOUNTER — Encounter (HOSPITAL_BASED_OUTPATIENT_CLINIC_OR_DEPARTMENT_OTHER): Payer: Self-pay | Admitting: Internal Medicine

## 2022-01-02 DIAGNOSIS — K859 Acute pancreatitis without necrosis or infection, unspecified: Secondary | ICD-10-CM | POA: Diagnosis present

## 2022-01-02 DIAGNOSIS — Z5329 Procedure and treatment not carried out because of patient's decision for other reasons: Secondary | ICD-10-CM | POA: Diagnosis present

## 2022-01-02 DIAGNOSIS — K852 Alcohol induced acute pancreatitis without necrosis or infection: Secondary | ICD-10-CM | POA: Diagnosis present

## 2022-01-02 DIAGNOSIS — F1010 Alcohol abuse, uncomplicated: Secondary | ICD-10-CM

## 2022-01-02 DIAGNOSIS — Z79899 Other long term (current) drug therapy: Secondary | ICD-10-CM | POA: Diagnosis not present

## 2022-01-02 DIAGNOSIS — F17210 Nicotine dependence, cigarettes, uncomplicated: Secondary | ICD-10-CM | POA: Diagnosis present

## 2022-01-02 LAB — COMPREHENSIVE METABOLIC PANEL
ALT: 16 U/L (ref 0–44)
AST: 21 U/L (ref 15–41)
Albumin: 4.3 g/dL (ref 3.5–5.0)
Alkaline Phosphatase: 75 U/L (ref 38–126)
Anion gap: 9 (ref 5–15)
BUN: 8 mg/dL (ref 6–20)
CO2: 26 mmol/L (ref 22–32)
Calcium: 9.4 mg/dL (ref 8.9–10.3)
Chloride: 102 mmol/L (ref 98–111)
Creatinine, Ser: 1.03 mg/dL (ref 0.61–1.24)
GFR, Estimated: >60 mL/min (ref >60)
Glucose, Bld: 110 mg/dL — ABNORMAL HIGH (ref 70–99)
Potassium: 3.9 mmol/L (ref 3.5–5.1)
Sodium: 137 mmol/L (ref 135–145)
Total Bilirubin: 0.6 mg/dL (ref 0.3–1.2)
Total Protein: 7.6 g/dL (ref 6.5–8.1)

## 2022-01-02 LAB — CBC WITH DIFFERENTIAL/PLATELET
Abs Immature Granulocytes: 0.03 10*3/uL (ref 0.00–0.07)
Basophils Absolute: 0 10*3/uL (ref 0.0–0.1)
Basophils Relative: 0 %
Eosinophils Absolute: 0.1 10*3/uL (ref 0.0–0.5)
Eosinophils Relative: 1 %
HCT: 43.6 % (ref 39.0–52.0)
Hemoglobin: 14.9 g/dL (ref 13.0–17.0)
Immature Granulocytes: 0 %
Lymphocytes Relative: 19 %
Lymphs Abs: 1.9 10*3/uL (ref 0.7–4.0)
MCH: 32 pg (ref 26.0–34.0)
MCHC: 34.2 g/dL (ref 30.0–36.0)
MCV: 93.8 fL (ref 80.0–100.0)
Monocytes Absolute: 0.7 10*3/uL (ref 0.1–1.0)
Monocytes Relative: 7 %
Neutro Abs: 7.3 10*3/uL (ref 1.7–7.7)
Neutrophils Relative %: 73 %
Platelets: 301 10*3/uL (ref 150–400)
RBC: 4.65 MIL/uL (ref 4.22–5.81)
RDW: 14 % (ref 11.5–15.5)
WBC: 10 10*3/uL (ref 4.0–10.5)
nRBC: 0 % (ref 0.0–0.2)

## 2022-01-02 LAB — LIPASE, BLOOD: Lipase: 1154 U/L — ABNORMAL HIGH (ref 11–51)

## 2022-01-02 LAB — URINALYSIS, ROUTINE W REFLEX MICROSCOPIC
Bilirubin Urine: NEGATIVE
Glucose, UA: NEGATIVE mg/dL
Hgb urine dipstick: NEGATIVE
Ketones, ur: NEGATIVE mg/dL
Leukocytes,Ua: NEGATIVE
Nitrite: NEGATIVE
Protein, ur: NEGATIVE mg/dL
Specific Gravity, Urine: 1.02 (ref 1.005–1.030)
pH: 7 (ref 5.0–8.0)

## 2022-01-02 LAB — HIV ANTIBODY (ROUTINE TESTING W REFLEX): HIV Screen 4th Generation wRfx: NONREACTIVE

## 2022-01-02 MED ORDER — ACETAMINOPHEN 650 MG RE SUPP: 650.0000 mg | Freq: Four times a day (QID) | RECTAL | Status: DC | PRN

## 2022-01-02 MED ORDER — THIAMINE HCL 100 MG PO TABS
100.0000 mg | ORAL_TABLET | Freq: Every day | ORAL | Status: DC
Start: 2022-01-02 — End: 2022-01-03
  Administered 2022-01-02 – 2022-01-03 (×2): 100 mg via ORAL
  Filled 2022-01-02 (×2): qty 1

## 2022-01-02 MED ORDER — LORAZEPAM 2 MG/ML IJ SOLN: 1.0000 mg | INTRAMUSCULAR | Status: DC | PRN

## 2022-01-02 MED ORDER — FENTANYL CITRATE PF 50 MCG/ML IJ SOSY
50.0000 ug | PREFILLED_SYRINGE | Freq: Once | INTRAMUSCULAR | Status: AC
  Administered 2022-01-02: 50 ug via INTRAVENOUS
  Filled 2022-01-02: qty 1

## 2022-01-02 MED ORDER — ONDANSETRON HCL 4 MG PO TABS: 4.0000 mg | ORAL_TABLET | Freq: Four times a day (QID) | ORAL | Status: DC | PRN

## 2022-01-02 MED ORDER — FOLIC ACID 1 MG PO TABS
1.0000 mg | ORAL_TABLET | Freq: Every day | ORAL | Status: DC
  Administered 2022-01-02 – 2022-01-03 (×2): 1 mg via ORAL
  Filled 2022-01-02 (×2): qty 1

## 2022-01-02 MED ORDER — ENOXAPARIN SODIUM 40 MG/0.4ML IJ SOSY
40.0000 mg | PREFILLED_SYRINGE | INTRAMUSCULAR | Status: DC
  Administered 2022-01-02 – 2022-01-03 (×2): 40 mg via SUBCUTANEOUS
  Filled 2022-01-02 (×2): qty 0.4

## 2022-01-02 MED ORDER — THIAMINE HCL 100 MG/ML IJ SOLN: 100.0000 mg | Freq: Every day | INTRAMUSCULAR | Status: DC

## 2022-01-02 MED ORDER — ACETAMINOPHEN 325 MG PO TABS: 650.0000 mg | ORAL_TABLET | Freq: Four times a day (QID) | ORAL | Status: DC | PRN

## 2022-01-02 MED ORDER — MORPHINE SULFATE (PF) 4 MG/ML IV SOLN
4.0000 mg | Freq: Once | INTRAVENOUS | Status: AC
  Administered 2022-01-02: 4 mg via INTRAVENOUS
  Filled 2022-01-02: qty 1

## 2022-01-02 MED ORDER — SODIUM CHLORIDE 0.9 % IV BOLUS
500.0000 mL | Freq: Once | INTRAVENOUS | Status: AC
  Administered 2022-01-02: 500 mL via INTRAVENOUS

## 2022-01-02 MED ORDER — LORAZEPAM 1 MG PO TABS
1.0000 mg | ORAL_TABLET | ORAL | Status: DC | PRN
  Administered 2022-01-03: 1 mg via ORAL
  Filled 2022-01-02: qty 1

## 2022-01-02 MED ORDER — ADULT MULTIVITAMIN W/MINERALS CH
1.0000 | ORAL_TABLET | Freq: Every day | ORAL | Status: DC
  Administered 2022-01-02 – 2022-01-03 (×2): 1 via ORAL
  Filled 2022-01-02 (×2): qty 1

## 2022-01-02 MED ORDER — SODIUM CHLORIDE 0.9 % IV SOLN: INTRAVENOUS | Status: DC

## 2022-01-02 MED ORDER — MORPHINE SULFATE (PF) 2 MG/ML IV SOLN
2.0000 mg | INTRAVENOUS | Status: DC | PRN
  Administered 2022-01-02 – 2022-01-03 (×9): 4 mg via INTRAVENOUS
  Filled 2022-01-02 (×10): qty 2

## 2022-01-02 MED ORDER — ONDANSETRON HCL 4 MG/2ML IJ SOLN: 4.0000 mg | Freq: Four times a day (QID) | INTRAMUSCULAR | Status: DC | PRN

## 2022-01-02 NOTE — Assessment & Plan Note (Addendum)
Pt with acute alcoholic pancreatitis, last drink just a couple days ago prior to onset of symptoms. Extensive history of same (looks like 5 visits to hospitals in the past 6 years for the same). -Was continued on IVF as tolerated -Presenting lipase in excess of 1150, lipase improved to 524 on 6/1 -Pt felt better, however continued to have abd pains with clears. Pt initially agreed to stay to slowly advance diet. However, on 6/1 in the afternoon, pt elected to leave against medical advice. Physician was alerted to pt leaving AMA after pt had already left.

## 2022-01-02 NOTE — H&P (Signed)
History and Physical    Patient: David Chapman ZOX:[id number] DOB: Aug 01, 1981 DOA: 01/01/2022 DOS: the patient was seen and examined on 01/02/2022 PCP: Pcp, No  Patient coming from: Home  Chief Complaint:  Chief Complaint  Patient presents with   Abdominal Pain   HPI: David Chapman is a 41 y.o. 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  Pt presents to ED with periumbilical abd pain.  Onset ~4 days ago.  Constant, worsening.  Associated nausea and anorexia.  Pain now severe.  Onset after he drank half a pint of vodka this past week.  Seen in [facility] earlier in the evening / day yesterday, diagnosed with acute pancreatitis.  Symptoms persisted and so went to [facility].    Review of Systems: As mentioned in the history of present illness. All other systems reviewed and are negative. Past Medical History:  Diagnosis Date   Alcohol induced acute pancreatitis    Back pain    History reviewed. No pertinent surgical history. Social History:  reports that he has been smoking cigarettes. He has never used smokeless tobacco. He reports current alcohol use. He reports that he does not currently use drugs after having used the following drugs: Marijuana.  Allergies  Allergen Reactions   Penicillins Other (See Comments) and Rash    unknown unknown Unknown     History reviewed. No pertinent family history.  Prior to Admission medications   Medication Sig Start Date End Date Taking? Authorizing Provider  benzonatate (TESSALON) 100 MG capsule Take 1 capsule (100 mg total) by mouth every 8 (eight) hours. 07/22/21   Renne Crigler, PA-C  diclofenac (VOLTAREN) 75 MG EC tablet Take 1 tablet (75 mg total) by mouth 2 (two) times daily. 12/15/21   Elson Areas, PA-C  ibuprofen (ADVIL,MOTRIN) 800 MG tablet Take 1,600 mg by mouth every 8 (eight) hours as needed. For pain     [provider]  ranitidine (ZANTAC)  150 MG capsule Take 1 capsule (150 mg total) by mouth daily. 02/17/18   Rancour, Jeannett Senior, MD  sucralfate (CARAFATE) 1 GM/10ML suspension Take 10 mLs (1 g total) by mouth 4 (four) times daily -  with meals and at bedtime. 11/30/19   Tilden Fossa, MD    Physical Exam: Vitals:   01/02/22 0230 01/02/22 0350 01/02/22 0357 01/02/22 0400  BP: (!) 125/101  (!) 143/109 (!) 148/97  Pulse: 72  70 65  Resp: 18  18 18   Temp:   98.3 F (36.8 C) 98.3 F (36.8 C)  TempSrc:   Oral Oral  SpO2: 99%  100% 100%  Weight:  72.1 kg    Height:  5\' 8"  (1.727 m) 5\' 8"  (1.727 m)    Constitutional: NAD, calm, comfortable Eyes: PERRL, lids and conjunctivae normal ENMT: Mucous membranes are moist. Posterior pharynx clear of any exudate or lesions.Normal dentition.  Neck: normal, supple, no masses, no thyromegaly Respiratory: clear to auscultation bilaterally, no wheezing, no crackles. Normal respiratory effort. No accessory muscle use.  Cardiovascular: Regular rate and rhythm, no murmurs / rubs / gallops. No extremity edema. 2+ pedal pulses. No carotid bruits.  Abdomen: Epigastric and periumbilical TTP. Musculoskeletal: no clubbing / cyanosis. No joint deformity upper and lower extremities. Good ROM, no contractures. Normal muscle tone.  Skin: no rashes, lesions, ulcers. No induration Neurologic: CN 2-12 grossly intact. Sensation intact, DTR normal. Strength 5/5 in all 4.  Psychiatric: Normal judgment and insight. Alert and oriented  x 3. Normal mood.   Data Reviewed:    CMP     Component Value Date/Time   NA 137 01/02/2022 0044   K 3.9 01/02/2022 0044   CL 102 01/02/2022 0044   CO2 26 01/02/2022 0044   GLUCOSE 110 (H) 01/02/2022 0044   BUN 8 01/02/2022 0044   CREATININE 1.03 01/02/2022 0044   CALCIUM 9.4 01/02/2022 0044   PROT 7.6 01/02/2022 0044   ALBUMIN 4.3 01/02/2022 0044   AST 21 01/02/2022 0044   ALT 16 01/02/2022 0044   ALKPHOS 75 01/02/2022 0044   BILITOT 0.6 01/02/2022 0044   GFRNONAA  >60 01/02/2022 0044   GFRAA >60 11/30/2019 0932   Lipase 1154  CT AP performed at [facility] earlier yesterday: IMPRESSION:  Inflammatory changes adjacent to the tail of the pancreas and  splenic flexure. In the absence of symptoms referable to the colon  and history of pancreatitis, favor this represents pancreatitis  rather than colitis.   Assessment and Plan: * Acute pancreatitis Pt with acute alcoholic pancreatitis, last drink just a couple days ago prior to onset of symptoms. Extensive history of same (looks like 5 visits to hospitals in the past 6 years for the same). IVF NPO Morphine PRN pain zofran PRN nausea Serial labs No obvious complications at this point Did strongly urge patient that he really needs to quit drinking... preferably before he gets complicated acute pancreatitis one of these times.  ETOH abuse CIWA      Advance Care Planning:   Code Status: Full Code  Consults: None  Family Communication: No family in room  Severity of Illness: The appropriate patient status for this patient is OBSERVATION. Observation status is judged to be reasonable and necessary in order to provide the required intensity of service to ensure the patient's safety. The patient's presenting symptoms, physical exam findings, and initial radiographic and laboratory data in the context of their medical condition is felt to place them at decreased risk for further clinical deterioration. Furthermore, it is anticipated that the patient will be medically stable for discharge from the hospital within 2 midnights of admission.   Author: Hillary Bow., DO 01/02/2022 5:20 AM  For on call review [email or web address].

## 2022-01-02 NOTE — Assessment & Plan Note (Addendum)
-  Continue CIWA protocol -current CIWA 0 -ETOH cessation done at bedside

## 2022-01-02 NOTE — TOC Initial Note (Signed)
Transition of Care [facility]) - Initial/Assessment Note    Patient Details  Name: David Chapman MRN: [id number] Date of Birth: May 02, 1981  [facility] CM/SW Contact:    Lanier Clam, RN Phone Number: 01/02/2022, 1:33 PM  Clinical Narrative: Patient has health insurance-[employer]. Provided w/pcp listing. He declines any etoh resource. [facility]-Has own transport home. No further CM needs.                  Expected Discharge Plan: Home/Self Care Barriers to Discharge: Continued Medical Work up   Patient Goals and CMS Choice Patient states their goals for this hospitalization and ongoing recovery are:: Home CMS Medicare.gov Compare Post Acute Care list provided to:: Patient Choice offered to / list presented to : Patient  Expected Discharge Plan and Services Expected Discharge Plan: Home/Self Care   Discharge Planning Services: CM Consult Post Acute Care Choice: NA Living arrangements for the past 2 months: Single Family Home                                      Prior Living Arrangements/Services Living arrangements for the past 2 months: Single Family Home Lives with:: Self Patient language and need for interpreter reviewed:: Yes Do you feel safe going back to the place where you live?: Yes      Need for Family Participation in Patient Care: Yes (Comment) Care giver support system in place?: Yes (comment)   Criminal Activity/Legal Involvement Pertinent to Current Situation/Hospitalization: No - Comment as needed  Activities of Daily Living Home Assistive Devices/Equipment: None ADL Screening (condition at time of admission) Patient's cognitive ability adequate to safely complete daily activities?: Yes Is the patient deaf or have difficulty hearing?: No Does the patient have difficulty seeing, even when wearing glasses/contacts?: No Does the patient have difficulty concentrating, remembering, or making decisions?: No Patient able to  express need for assistance with ADLs?: No Does the patient have difficulty dressing or bathing?: No Independently performs ADLs?: Yes (appropriate for developmental age) Does the patient have difficulty walking or climbing stairs?: No Weakness of Legs: None Weakness of Arms/Hands: None  Permission Sought/Granted Permission sought to share information with : Case Manager Permission granted to share information with : Yes, Verbal Permission Granted  Share Information with NAME: Case manager           Emotional Assessment Appearance:: Appears stated age Attitude/Demeanor/Rapport: Gracious Affect (typically observed): Accepting Orientation: : Oriented to Self, Oriented to Place, Oriented to  Time, Oriented to Situation Alcohol / Substance Use: Alcohol Use Psych Involvement: No (comment)  Admission diagnosis:  Acute pancreatitis [K85.90] Alcohol-induced acute pancreatitis, unspecified complication status [K85.20] Patient Active Problem List   Diagnosis Date Noted   Acute pancreatitis 01/02/2022   ETOH abuse 01/02/2022   PCP:  Pcp, No Pharmacy:   [id number] [place], [place] - [address], [address] [facility] [address], [address] [place] [address] Phone: [phone] Fax: [phone]     Social Determinants of Health (SDOH) Interventions    Readmission Risk Interventions     View : No data to display.
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
## 2022-01-02 NOTE — Progress Notes (Signed)
  Progress Note   Patient: David Chapman SWN:[id number] DOB: 1981/06/20 DOA: 01/01/2022     0 DOS: the patient was seen and examined on 01/02/2022   Brief hospital course: 41 y.o. 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   Pt presents to ED with periumbilical abd pain.  Onset ~4 days ago.  Constant, worsening.  Associated nausea and anorexia.  Pain now severe.   Onset after he drank half a pint of vodka this past week.   Seen in [facility] earlier in the evening / day yesterday, diagnosed with acute pancreatitis.  Symptoms persisted and so went to [facility].  Assessment and Plan: * Acute pancreatitis Pt with acute alcoholic pancreatitis, last drink just a couple days ago prior to onset of symptoms. Extensive history of same (looks like 5 visits to hospitals in the past 6 years for the same). -Cont on IVF as tolerated -Presenting lipase in excess of 1150, follow trends -Pt continues with epigastric pain. Will keep NPO with basal IVF -Repeat cmp in AM  ETOH abuse -Continue CIWA protocol -current CIWA 0 -ETOH cessation done at bedside      Subjective: Still complaining of epigastric pain  Physical Exam: Vitals:   01/02/22 0357 01/02/22 0400 01/02/22 0812 01/02/22 1138  BP: (!) 143/109 (!) 148/97 (!) 135/95 128/86  Pulse: 70 65 70 71  Resp: 18 18 16 16   Temp: 98.3 F (36.8 C) 98.3 F (36.8 C) 98.2 F (36.8 C) 98.7 F (37.1 C)  TempSrc: Oral Oral Oral Oral  SpO2: 100% 100% 99% 98%  Weight:      Height: 5\' 8"  (1.727 m)      General exam: Awake, laying in bed, in nad Respiratory system: Normal respiratory effort, no wheezing Cardiovascular system: regular rate, s1, s2 Gastrointestinal system: Soft, nondistended, positive BS Central nervous system: CN2-12 grossly intact, strength intact Extremities: Perfused, no clubbing Skin: Normal skin turgor, no notable skin lesions seen Psychiatry: Mood normal  // no visual hallucinations   Data Reviewed:  There are no new results to review at this time.  Family Communication: Pt in room, family not at bedside  Disposition: Status is: Inpatient Remains inpatient appropriate because: Severity of illness  Planned Discharge Destination: Home     Author: , MD 01/02/2022 12:54 PM  For on call review [email or web address].

## 2022-01-03 DIAGNOSIS — K852 Alcohol induced acute pancreatitis without necrosis or infection: Secondary | ICD-10-CM | POA: Diagnosis not present

## 2022-01-03 DIAGNOSIS — F1010 Alcohol abuse, uncomplicated: Secondary | ICD-10-CM | POA: Diagnosis not present

## 2022-01-03 LAB — COMPREHENSIVE METABOLIC PANEL
ALT: 11 U/L (ref 0–44)
AST: 15 U/L (ref 15–41)
Albumin: 3.9 g/dL (ref 3.5–5.0)
Alkaline Phosphatase: 64 U/L (ref 38–126)
Anion gap: 8 (ref 5–15)
BUN: 6 mg/dL (ref 6–20)
CO2: 24 mmol/L (ref 22–32)
Calcium: 9.1 mg/dL (ref 8.9–10.3)
Chloride: 105 mmol/L (ref 98–111)
Creatinine, Ser: 0.77 mg/dL (ref 0.61–1.24)
GFR, Estimated: >60 mL/min (ref >60)
Glucose, Bld: 89 mg/dL (ref 70–99)
Potassium: 4.1 mmol/L (ref 3.5–5.1)
Sodium: 137 mmol/L (ref 135–145)
Total Bilirubin: 0.9 mg/dL (ref 0.3–1.2)
Total Protein: 6.9 g/dL (ref 6.5–8.1)

## 2022-01-03 LAB — CBC
HCT: 43.3 % (ref 39.0–52.0)
Hemoglobin: 14.2 g/dL (ref 13.0–17.0)
MCH: 31.6 pg (ref 26.0–34.0)
MCHC: 32.8 g/dL (ref 30.0–36.0)
MCV: 96.4 fL (ref 80.0–100.0)
Platelets: 248 10*3/uL (ref 150–400)
RBC: 4.49 MIL/uL (ref 4.22–5.81)
RDW: 13.9 % (ref 11.5–15.5)
WBC: 6 10*3/uL (ref 4.0–10.5)
nRBC: 0 % (ref 0.0–0.2)

## 2022-01-03 LAB — LIPASE, BLOOD: Lipase: 524 U/L — ABNORMAL HIGH (ref 11–51)

## 2022-01-03 LAB — MAGNESIUM: Magnesium: 2.2 mg/dL (ref 1.7–2.4)

## 2022-01-03 MED ORDER — KETOROLAC TROMETHAMINE 30 MG/ML IJ SOLN
30.0000 mg | Freq: Four times a day (QID) | INTRAMUSCULAR | Status: DC | PRN
  Administered 2022-01-03: 30 mg via INTRAVENOUS
  Filled 2022-01-03: qty 1

## 2022-01-03 NOTE — Progress Notes (Signed)
  Progress Note   Patient: David Chapman [id number] DOB: [birth year] DOA: 01/01/2022     1 DOS: the patient was seen and examined on 01/03/2022   Brief hospital course: 41 y.o. 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   Pt presents to ED with periumbilical abd pain.  Onset ~4 days ago.  Constant, worsening.  Associated nausea and anorexia.  Pain now severe.   Onset after he drank half a pint of vodka this past week.   Seen in [facility] earlier in the evening / day yesterday, diagnosed with acute pancreatitis.  Symptoms persisted and so went to [facility].  Assessment and Plan: * Acute pancreatitis Pt with acute alcoholic pancreatitis, last drink just a couple days ago prior to onset of symptoms. Extensive history of same (looks like 5 visits to hospitals in the past 6 years for the same). -Cont on IVF as tolerated -Presenting lipase in excess of 1150, lipase improved to 524 this morning -Pt tolerating clears this morning.  Would cautiously advance as patient would tolerate -Repeat cmp and lipase in AM  ETOH abuse -Continue CIWA protocol -CIWA had remained minimal at around 0, in the afternoon patient noted to have CIWA of 9 -ETOH cessation done at bedside earlier      Subjective: Tolerating clear liquid diet.  Denies nausea currently  Physical Exam: Vitals:   01/02/22 1553 01/02/22 2030 01/03/22 0154 01/03/22 0852  BP: (!) 124/97 122/85 123/84 121/87  Pulse: 80 97 92 89  Resp:  14 18 16   Temp: 97.7 F (36.5 C) 99 F (37.2 C) 98.9 F (37.2 C) 98.7 F (37.1 C)  TempSrc:  Oral Oral Oral  SpO2: 100% 97% 98% 100%  Weight:      Height:       General exam: Conversant, in no acute distress Respiratory system: normal chest rise, clear, no audible wheezing Cardiovascular system: regular rhythm, s1-s2 Gastrointestinal system: Nondistended, nontender, pos BS Central nervous system: No seizures, no  tremors Extremities: No cyanosis, no joint deformities Skin: No rashes, no pallor Psychiatry: Affect normal // no auditory hallucinations   Data Reviewed:  Labs reviewed: Lipase 524  Family Communication: Pt in room, family not at bedside  Disposition: Status is: Inpatient Remains inpatient appropriate because: Severity of illness  Planned Discharge Destination: Home     Author: Marylu Lund, MD 01/03/2022 5:05 PM  For on call review [email or web address].

## 2022-01-03 NOTE — Discharge Summary (Signed)
Physician Discharge Summary   Patient: David Chapman MRN: [id number] DOB: 1981/07/16  Admit date:     01/01/2022  Discharge date: 01/03/22  Discharge Physician: Rickey Barbara   PCP: Pcp, No   PATIENT LEFT AGAINST MEDICAL ADVICE  Discharge Diagnoses: Principal Problem:   Acute pancreatitis Active Problems:   ETOH abuse  Hospital Course: 41 y.o. 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   Pt presents to ED with periumbilical abd pain.  Onset ~4 days ago.  Constant, worsening.  Associated nausea and anorexia.  Pain now severe.   Onset after he drank half a pint of vodka this past week.   Seen in [facility] earlier in the evening / day yesterday, diagnosed with acute pancreatitis.  Symptoms persisted and so went to [facility].  Assessment and Plan: * Acute pancreatitis Pt with acute alcoholic pancreatitis, last drink just a couple days ago prior to onset of symptoms. Extensive history of same (looks like 5 visits to hospitals in the past 6 years for the same). -Was continued on IVF as tolerated -Presenting lipase in excess of 1150, lipase improved to 524 on 6/1 -Pt felt better, however continued to have abd pains with clears. Pt initially agreed to stay to slowly advance diet. However, on 6/1 in the afternoon, pt elected to leave against medical advice. Physician was alerted to pt leaving AMA after pt had already left.  ETOH abuse -Continue CIWA protocol         DISCHARGE MEDICATION:   Follow-up Information     Primary Care Physician. Schedule an appointment as soon as possible for a visit.                 Discharge Exam: Filed Weights   01/01/22 2143 01/02/22 0350  Weight: 76.2 kg 72.1 kg   Left against medical advice  Condition at discharge:  AMA  The results of significant diagnostics from this hospitalization (including imaging, microbiology, ancillary and laboratory) are listed  below for reference.   Imaging Studies: DG Foot Complete Left  Result Date: 12/15/2021 CLINICAL DATA:  Trauma, pain EXAM: LEFT FOOT - COMPLETE 3+ VIEW COMPARISON:  None Available. FINDINGS: There is no evidence of fracture or dislocation. There is no evidence of arthropathy or other focal bone abnormality. Soft tissues are unremarkable. IMPRESSION: No fracture or dislocation is seen in the left foot. Electronically Signed   By: Ernie Avena M.D.   On: 12/15/2021 14:57    Microbiology: Results for orders placed or performed during the hospital encounter of 07/22/21  Resp Panel by RT-PCR (Flu A&B, Covid) Nasopharyngeal Swab     Status: None   Collection Time: 07/22/21  4:00 PM   Specimen: Nasopharyngeal Swab; Nasopharyngeal(NP) swabs in vial transport medium  Result Value Ref Range Status   SARS Coronavirus 2 by RT PCR NEGATIVE NEGATIVE Final    Comment: (NOTE) SARS-CoV-2 target nucleic acids are NOT DETECTED.  The SARS-CoV-2 RNA is generally detectable in upper respiratory specimens during the acute phase of infection. The lowest concentration of SARS-CoV-2 viral copies this assay can detect is 138 copies/mL. A negative result does not preclude SARS-Cov-2 infection and should not be used as the sole basis for treatment or other patient management decisions. A negative result may occur with  improper specimen collection/handling, submission of specimen other than nasopharyngeal swab, presence of viral mutation(s) within the areas targeted by this assay, and inadequate number of viral copies(<138 copies/mL). A  negative result must be combined with clinical observations, patient history, and epidemiological information. The expected result is Negative.  Fact Sheet for Patients:  BloggerCourse.com  Fact Sheet for Healthcare Providers:  SeriousBroker.it  This test is no t yet approved or cleared by the [place] FDA and  has  been authorized for detection and/or diagnosis of SARS-CoV-2 by FDA under an Emergency Use Authorization (EUA). This EUA will remain  in effect (meaning this test can be used) for the duration of the COVID-19 declaration under Section 564(b)(1) of the Act, 21 U.S.C.section 360bbb-3(b)(1), unless the authorization is terminated  or revoked sooner.       Influenza A by PCR NEGATIVE NEGATIVE Final   Influenza B by PCR NEGATIVE NEGATIVE Final    Comment: (NOTE) The Xpert Xpress SARS-CoV-2/FLU/RSV plus assay is intended as an aid in the diagnosis of influenza from Nasopharyngeal swab specimens and should not be used as a sole basis for treatment. Nasal washings and aspirates are unacceptable for Xpert Xpress SARS-CoV-2/FLU/RSV testing.  Fact Sheet for Patients: BloggerCourse.com  Fact Sheet for Healthcare Providers: SeriousBroker.it  This test is not yet approved or cleared by the [place] FDA and has been authorized for detection and/or diagnosis of SARS-CoV-2 by FDA under an Emergency Use Authorization (EUA). This EUA will remain in effect (meaning this test can be used) for the duration of the COVID-19 declaration under Section 564(b)(1) of the Act, 21 U.S.C. section 360bbb-3(b)(1), unless the authorization is terminated or revoked.  Performed at [facility], [address] [place], [place] [address]     Labs: CBC: Recent Labs  Lab 01/02/22 0044 01/03/22 0501  WBC 10.0 6.0  NEUTROABS 7.3  --   HGB 14.9 14.2  HCT 43.6 43.3  MCV 93.8 96.4  PLT 301 248   Basic Metabolic Panel: Recent Labs  Lab 01/02/22 0044 01/03/22 0501  NA 137 137  K 3.9 4.1  CL 102 105  CO2 26 24  GLUCOSE 110* 89  BUN 8 6  CREATININE 1.03 0.77  CALCIUM 9.4 9.1  MG  --  2.2   Liver Function Tests: Recent Labs  Lab 01/02/22 0044 01/03/22 0501  AST 21 15  ALT 16 11  ALKPHOS 75 64  BILITOT 0.6 0.9  PROT 7.6 6.9   ALBUMIN 4.3 3.9   CBG: No results for input(s): GLUCAP in the last 168 hours.   Signed: Rickey Barbara, MD [facility] 01/03/2022

## 2022-01-03 NOTE — Progress Notes (Signed)
The patient requested RN come to room. Upon entry the patient RN witnessed patient and visitor removing IV and telemetry monitor. He refused to not sign AMA form and was not agreeable to receive education regarding leaving against medical advice. No change from am assessment. Pt was alert, oriented and ambulatory. He left with all personal belongings. Will update provider.

## 2023-12-23 IMAGING — DX DG FOOT COMPLETE 3+V*L*
3 series · 3 of 3 positions shown · non-contrast
Comparison: None Available.

CLINICAL DATA: Trauma, pain

EXAM:
LEFT FOOT - COMPLETE 3+ VIEW

[foot ap]
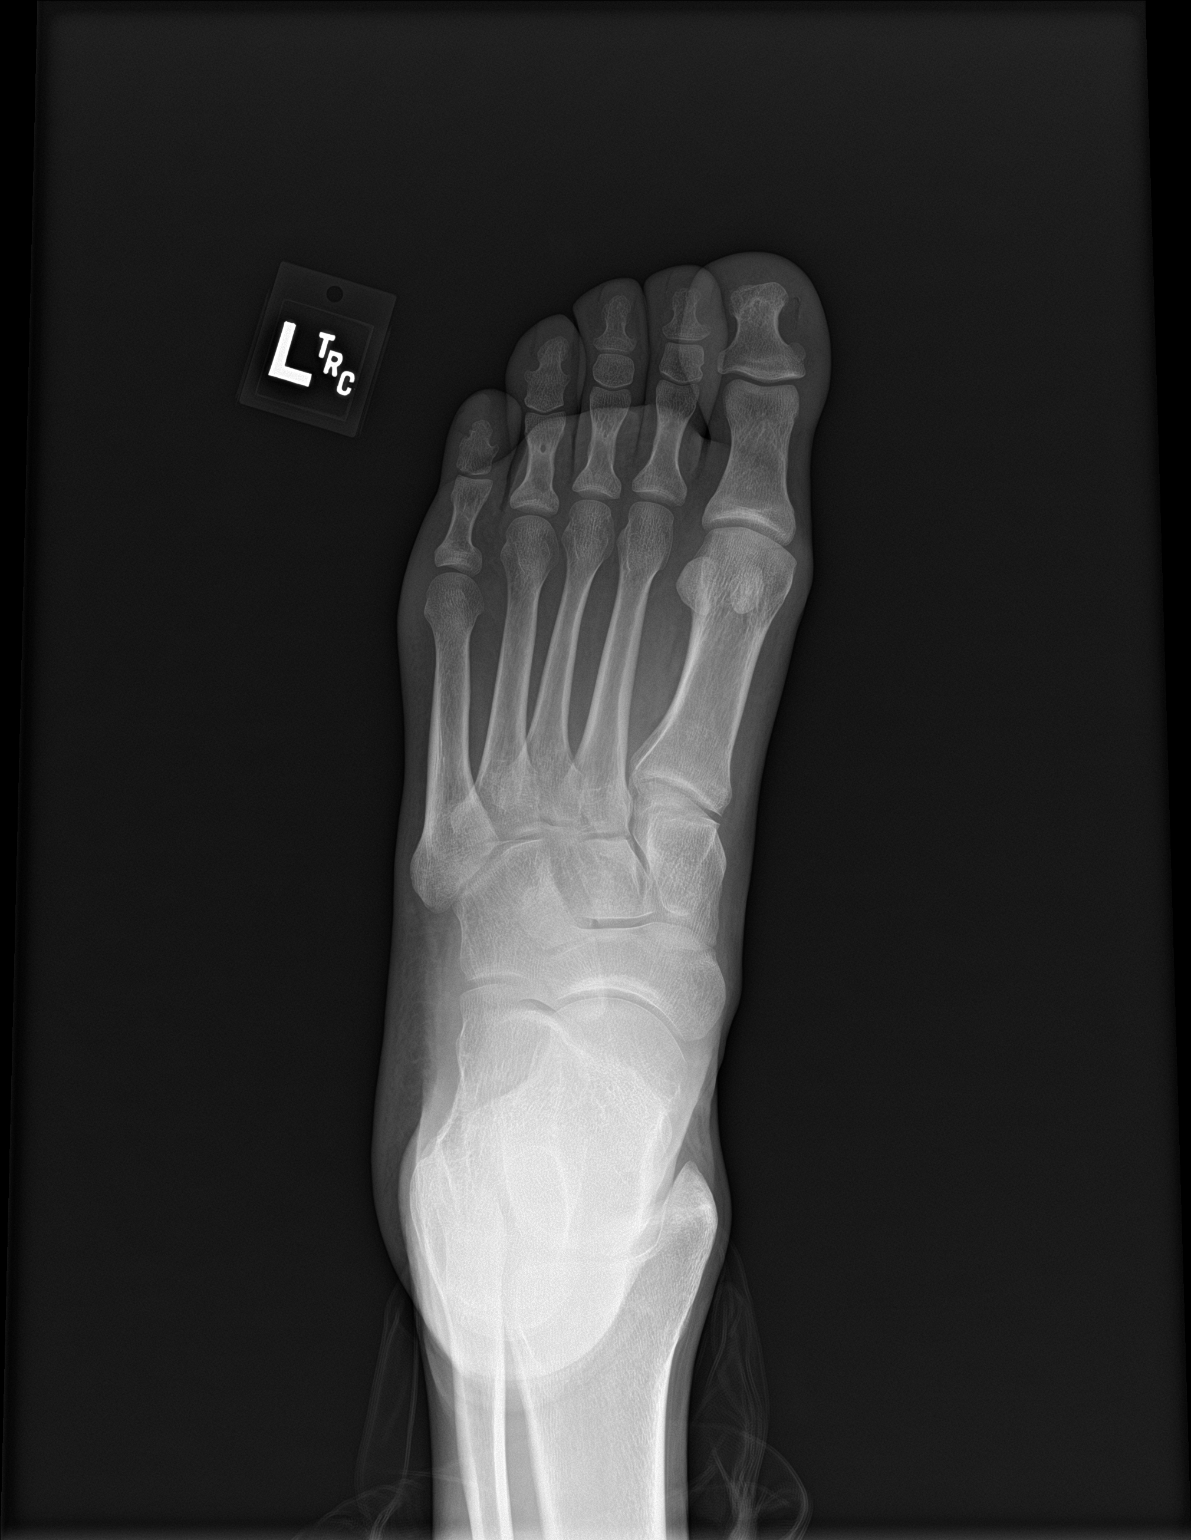

[foot obl]
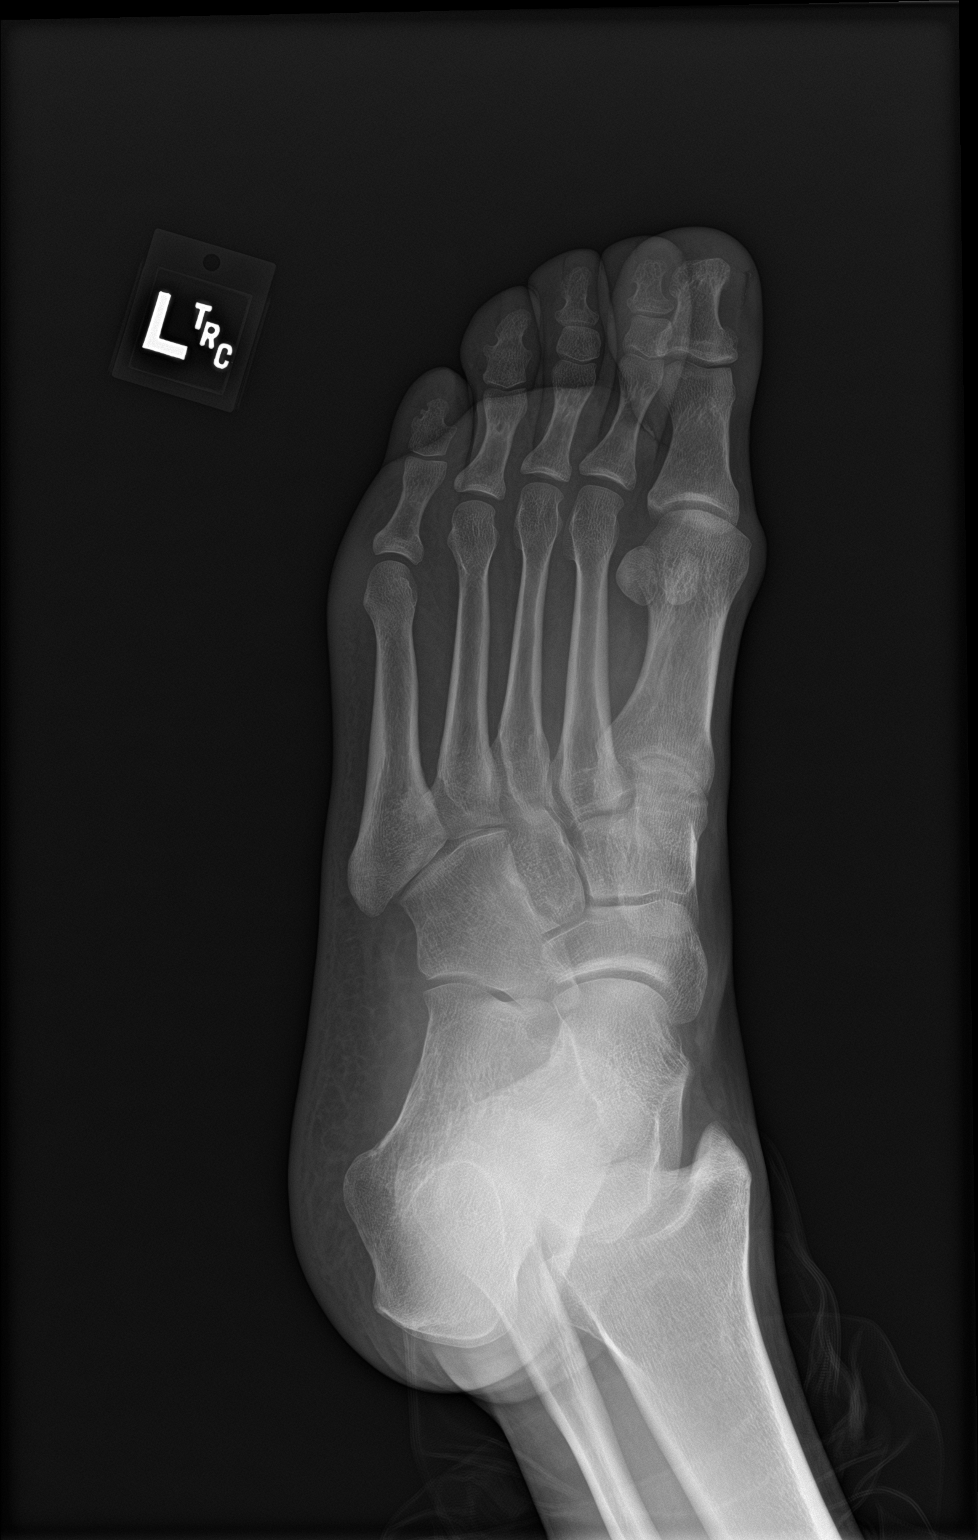

[foot lat]
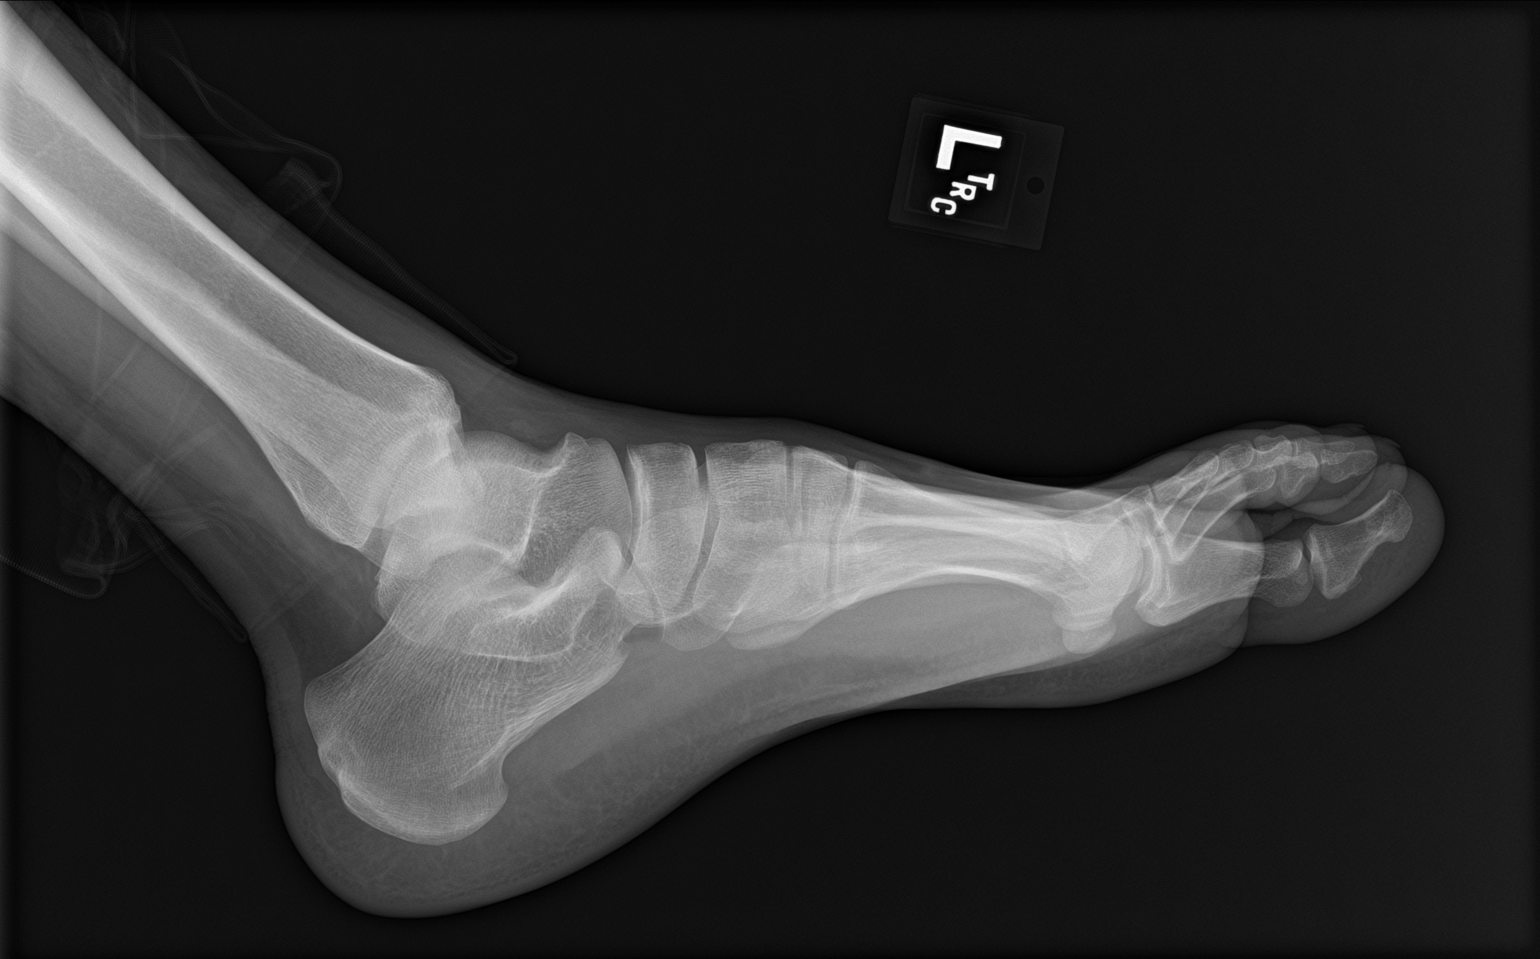

[3 of 3 positions shown; findings below may reference images not displayed]

FINDINGS: There is no evidence of fracture or dislocation. There is no
evidence of arthropathy or other focal bone abnormality. Soft
tissues are unremarkable.
IMPRESSION: No fracture or dislocation is seen in the left foot.
